# Patient Record
Sex: Female | Born: 1935 | Race: White | Hispanic: No | Smoking: Former smoker
Health system: Southern US, Community
[De-identification: ages and names within clinical notes are randomized; demographics above are authoritative.]

## PROBLEM LIST (undated history)

## (undated) DIAGNOSIS — C801 Malignant (primary) neoplasm, unspecified: Secondary | ICD-10-CM

## (undated) DIAGNOSIS — I10 Essential (primary) hypertension: Secondary | ICD-10-CM

## (undated) DIAGNOSIS — E079 Disorder of thyroid, unspecified: Secondary | ICD-10-CM

---

## 2003-12-12 HISTORY — PX: BREAST LUMPECTOMY: SHX2

## 2019-01-29 ENCOUNTER — Inpatient Hospital Stay
Admission: EM | Admit: 2019-01-29 | Discharge: 2019-02-03 | DRG: 862 | Disposition: A | Discharge status: Home / Self Care | Payer: Medicare HMO | Attending: Surgery | Admitting: Surgery

## 2019-01-29 ENCOUNTER — Encounter: Payer: Self-pay | Admitting: Emergency Medicine

## 2019-01-29 ENCOUNTER — Emergency Department: Payer: Medicare HMO

## 2019-01-29 ENCOUNTER — Other Ambulatory Visit: Payer: Self-pay

## 2019-01-29 DIAGNOSIS — K5730 Diverticulosis of large intestine without perforation or abscess without bleeding: Secondary | ICD-10-CM | POA: Diagnosis present

## 2019-01-29 DIAGNOSIS — C772 Secondary and unspecified malignant neoplasm of intra-abdominal lymph nodes: Secondary | ICD-10-CM | POA: Diagnosis present

## 2019-01-29 DIAGNOSIS — Z923 Personal history of irradiation: Secondary | ICD-10-CM | POA: Diagnosis not present

## 2019-01-29 DIAGNOSIS — D649 Anemia, unspecified: Secondary | ICD-10-CM | POA: Diagnosis present

## 2019-01-29 DIAGNOSIS — Z79899 Other long term (current) drug therapy: Secondary | ICD-10-CM

## 2019-01-29 DIAGNOSIS — N131 Hydronephrosis with ureteral stricture, not elsewhere classified: Secondary | ICD-10-CM | POA: Diagnosis present

## 2019-01-29 DIAGNOSIS — E079 Disorder of thyroid, unspecified: Secondary | ICD-10-CM | POA: Diagnosis present

## 2019-01-29 DIAGNOSIS — Z79890 Hormone replacement therapy (postmenopausal): Secondary | ICD-10-CM | POA: Diagnosis not present

## 2019-01-29 DIAGNOSIS — C679 Malignant neoplasm of bladder, unspecified: Secondary | ICD-10-CM | POA: Diagnosis not present

## 2019-01-29 DIAGNOSIS — K651 Peritoneal abscess: Secondary | ICD-10-CM | POA: Diagnosis present

## 2019-01-29 DIAGNOSIS — C662 Malignant neoplasm of left ureter: Secondary | ICD-10-CM | POA: Diagnosis present

## 2019-01-29 DIAGNOSIS — T8143XA Infection following a procedure, organ and space surgical site, initial encounter: Secondary | ICD-10-CM | POA: Diagnosis present

## 2019-01-29 DIAGNOSIS — K5900 Constipation, unspecified: Secondary | ICD-10-CM | POA: Diagnosis present

## 2019-01-29 DIAGNOSIS — Y838 Other surgical procedures as the cause of abnormal reaction of the patient, or of later complication, without mention of misadventure at the time of the procedure: Secondary | ICD-10-CM | POA: Diagnosis present

## 2019-01-29 DIAGNOSIS — Z791 Long term (current) use of non-steroidal anti-inflammatories (NSAID): Secondary | ICD-10-CM | POA: Diagnosis not present

## 2019-01-29 DIAGNOSIS — I10 Essential (primary) hypertension: Secondary | ICD-10-CM | POA: Diagnosis present

## 2019-01-29 DIAGNOSIS — Z79891 Long term (current) use of opiate analgesic: Secondary | ICD-10-CM

## 2019-01-29 DIAGNOSIS — Z681 Body mass index (BMI) 19 or less, adult: Secondary | ICD-10-CM | POA: Diagnosis not present

## 2019-01-29 DIAGNOSIS — L8990 Pressure ulcer of unspecified site, unspecified stage: Secondary | ICD-10-CM

## 2019-01-29 DIAGNOSIS — Z853 Personal history of malignant neoplasm of breast: Secondary | ICD-10-CM

## 2019-01-29 DIAGNOSIS — R109 Unspecified abdominal pain: Secondary | ICD-10-CM

## 2019-01-29 DIAGNOSIS — T8149XA Infection following a procedure, other surgical site, initial encounter: Secondary | ICD-10-CM

## 2019-01-29 DIAGNOSIS — E440 Moderate protein-calorie malnutrition: Secondary | ICD-10-CM | POA: Diagnosis present

## 2019-01-29 DIAGNOSIS — N200 Calculus of kidney: Secondary | ICD-10-CM | POA: Diagnosis present

## 2019-01-29 DIAGNOSIS — Z87891 Personal history of nicotine dependence: Secondary | ICD-10-CM

## 2019-01-29 HISTORY — DX: Malignant (primary) neoplasm, unspecified: C80.1

## 2019-01-29 HISTORY — DX: Disorder of thyroid, unspecified: E07.9

## 2019-01-29 HISTORY — DX: Essential (primary) hypertension: I10

## 2019-01-29 LAB — LACTIC ACID, PLASMA: Lactic Acid, Venous: 1.1 mmol/L (ref 0.5–1.9)

## 2019-01-29 LAB — CBC WITH DIFFERENTIAL/PLATELET
Abs Immature Granulocytes: 0.06 10*3/uL (ref 0.00–0.07)
Basophils Absolute: 0 10*3/uL (ref 0.0–0.1)
Basophils Relative: 0 %
Eosinophils Absolute: 0.1 10*3/uL (ref 0.0–0.5)
Eosinophils Relative: 1 %
HCT: 28.9 % — ABNORMAL LOW (ref 36.0–46.0)
Hemoglobin: 9.1 g/dL — ABNORMAL LOW (ref 12.0–15.0)
Immature Granulocytes: 1 %
Lymphocytes Relative: 11 %
Lymphs Abs: 1 10*3/uL (ref 0.7–4.0)
MCH: 27.6 pg (ref 26.0–34.0)
MCHC: 31.5 g/dL (ref 30.0–36.0)
MCV: 87.6 fL (ref 80.0–100.0)
Monocytes Absolute: 0.8 10*3/uL (ref 0.1–1.0)
Monocytes Relative: 9 %
NEUTROS PCT: 78 %
Neutro Abs: 7.1 10*3/uL (ref 1.7–7.7)
Platelets: 311 10*3/uL (ref 150–400)
RBC: 3.3 MIL/uL — ABNORMAL LOW (ref 3.87–5.11)
RDW: 14.6 % (ref 11.5–15.5)
WBC: 9.1 10*3/uL (ref 4.0–10.5)
nRBC: 0 % (ref 0.0–0.2)

## 2019-01-29 LAB — COMPREHENSIVE METABOLIC PANEL
ALT: 72 U/L — AB (ref 0–44)
AST: 79 U/L — ABNORMAL HIGH (ref 15–41)
Albumin: 2.2 g/dL — ABNORMAL LOW (ref 3.5–5.0)
Alkaline Phosphatase: 426 U/L — ABNORMAL HIGH (ref 38–126)
Anion gap: 6 (ref 5–15)
BUN: 16 mg/dL (ref 8–23)
CO2: 26 mmol/L (ref 22–32)
Calcium: 8 mg/dL — ABNORMAL LOW (ref 8.9–10.3)
Chloride: 98 mmol/L (ref 98–111)
Creatinine, Ser: 0.99 mg/dL (ref 0.44–1.00)
GFR calc Af Amer: >60 mL/min (ref >60)
GFR calc non Af Amer: 54 mL/min — ABNORMAL LOW (ref >60)
Glucose, Bld: 111 mg/dL — ABNORMAL HIGH (ref 70–99)
Potassium: 3.9 mmol/L (ref 3.5–5.1)
Sodium: 130 mmol/L — ABNORMAL LOW (ref 135–145)
Total Bilirubin: 1.5 mg/dL — ABNORMAL HIGH (ref 0.3–1.2)
Total Protein: 6.4 g/dL — ABNORMAL LOW (ref 6.5–8.1)

## 2019-01-29 LAB — CBC
HCT: 31.9 % — ABNORMAL LOW (ref 36.0–46.0)
Hemoglobin: 10.1 g/dL — ABNORMAL LOW (ref 12.0–15.0)
MCH: 27.4 pg (ref 26.0–34.0)
MCHC: 31.7 g/dL (ref 30.0–36.0)
MCV: 86.7 fL (ref 80.0–100.0)
Platelets: 382 10*3/uL (ref 150–400)
RBC: 3.68 MIL/uL — ABNORMAL LOW (ref 3.87–5.11)
RDW: 14.6 % (ref 11.5–15.5)
WBC: 12 10*3/uL — AB (ref 4.0–10.5)
nRBC: 0 % (ref 0.0–0.2)

## 2019-01-29 LAB — URINALYSIS, COMPLETE (UACMP) WITH MICROSCOPIC
BILIRUBIN URINE: NEGATIVE
Glucose, UA: NEGATIVE mg/dL
Ketones, ur: NEGATIVE mg/dL
Nitrite: NEGATIVE
Protein, ur: NEGATIVE mg/dL
Specific Gravity, Urine: 1.012 (ref 1.005–1.030)
WBC, UA: >50 WBC/hpf — ABNORMAL HIGH (ref 0–5)
pH: 6 (ref 5.0–8.0)

## 2019-01-29 LAB — TYPE AND SCREEN
ABO/RH(D): A POS
Antibody Screen: NEGATIVE

## 2019-01-29 LAB — PROTIME-INR
INR: 1.07
Prothrombin Time: 13.8 seconds (ref 11.4–15.2)

## 2019-01-29 LAB — LIPASE, BLOOD: Lipase: 98 U/L — ABNORMAL HIGH (ref 11–51)

## 2019-01-29 MED ORDER — IOPAMIDOL (ISOVUE-300) INJECTION 61%
30.0000 mL | Freq: Once | INTRAVENOUS | Status: AC | PRN
  Administered 2019-01-29: 30 mL via ORAL

## 2019-01-29 MED ORDER — LEVOTHYROXINE SODIUM 88 MCG PO TABS
88.0000 ug | ORAL_TABLET | Freq: Every day | ORAL | Status: DC
  Administered 2019-01-30 – 2019-02-03 (×4): 88 ug via ORAL
  Filled 2019-01-29 (×5): qty 1

## 2019-01-29 MED ORDER — ONDANSETRON 4 MG PO TBDP: 4.0000 mg | ORAL_TABLET | Freq: Four times a day (QID) | ORAL | Status: DC | PRN

## 2019-01-29 MED ORDER — ONDANSETRON HCL 4 MG/2ML IJ SOLN: 4.0000 mg | Freq: Four times a day (QID) | INTRAMUSCULAR | Status: DC | PRN

## 2019-01-29 MED ORDER — LISINOPRIL 10 MG PO TABS
5.0000 mg | ORAL_TABLET | Freq: Every day | ORAL | Status: DC
  Administered 2019-02-02 – 2019-02-03 (×2): 5 mg via ORAL
  Filled 2019-01-29 (×2): qty 1

## 2019-01-29 MED ORDER — LACTATED RINGERS IV SOLN
INTRAVENOUS | Status: DC
  Administered 2019-01-29 – 2019-01-30 (×3): via INTRAVENOUS

## 2019-01-29 MED ORDER — PIPERACILLIN-TAZOBACTAM 3.375 G IVPB 30 MIN
3.3750 g | Freq: Once | INTRAVENOUS | Status: AC
  Administered 2019-01-29: 3.375 g via INTRAVENOUS
  Filled 2019-01-29: qty 50

## 2019-01-29 MED ORDER — SODIUM CHLORIDE 0.9 % IV BOLUS
1000.0000 mL | Freq: Once | INTRAVENOUS | Status: AC
  Administered 2019-01-29: 1000 mL via INTRAVENOUS

## 2019-01-29 MED ORDER — SODIUM CHLORIDE 0.9% FLUSH
3.0000 mL | Freq: Once | INTRAVENOUS | Status: AC
  Administered 2019-01-29: 3 mL via INTRAVENOUS

## 2019-01-29 MED ORDER — ACETAMINOPHEN 325 MG PO TABS
650.0000 mg | ORAL_TABLET | Freq: Four times a day (QID) | ORAL | Status: DC | PRN
  Administered 2019-01-29 – 2019-01-31 (×4): 650 mg via ORAL
  Filled 2019-01-29 (×4): qty 2

## 2019-01-29 MED ORDER — ACETAMINOPHEN 650 MG RE SUPP: 650.0000 mg | Freq: Four times a day (QID) | RECTAL | Status: DC | PRN

## 2019-01-29 MED ORDER — IOHEXOL 300 MG/ML  SOLN
75.0000 mL | Freq: Once | INTRAMUSCULAR | Status: AC | PRN
  Administered 2019-01-29: 75 mL via INTRAVENOUS

## 2019-01-29 MED ORDER — PIPERACILLIN-TAZOBACTAM 3.375 G IVPB
3.3750 g | Freq: Three times a day (TID) | INTRAVENOUS | Status: DC
  Administered 2019-01-30 – 2019-02-02 (×11): 3.375 g via INTRAVENOUS
  Filled 2019-01-29 (×11): qty 50

## 2019-01-29 MED ORDER — MORPHINE SULFATE (PF) 2 MG/ML IV SOLN: 2.0000 mg | INTRAVENOUS | Status: DC | PRN

## 2019-01-29 MED ORDER — DOCUSATE SODIUM 100 MG PO CAPS: 100.0000 mg | ORAL_CAPSULE | Freq: Two times a day (BID) | ORAL | Status: DC | PRN

## 2019-01-29 MED ORDER — PANTOPRAZOLE SODIUM 40 MG IV SOLR
40.0000 mg | Freq: Every day | INTRAVENOUS | Status: DC
  Administered 2019-01-29 – 2019-02-01 (×4): 40 mg via INTRAVENOUS
  Filled 2019-01-29 (×4): qty 40

## 2019-01-29 NOTE — ED Notes (Signed)
Pt returned from CT °

## 2019-01-29 NOTE — ED Notes (Signed)
Pt ambulatory to toilet. Warm blanket given.

## 2019-01-29 NOTE — ED Notes (Signed)
MD at bedside. 

## 2019-01-29 NOTE — ED Notes (Signed)
RN Anda Kraft notified pt to placed in [facility]

## 2019-01-29 NOTE — ED Provider Notes (Addendum)
[facility] Emergency Department Provider Note  ____________________________________________   I have reviewed the triage vital signs and the nursing notes. Where available I have reviewed prior notes and, if possible and indicated, outside hospital notes.    HISTORY  Chief Complaint Abdominal Pain    HPI Tonya Crawford is a 83 y.o. female who presents today complaining of concern about a possible SBO.  Patient is not a patient of this hospital.  She has had a recent complicated history at a outside facility that the records are now transferring over from.  [facility].  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  This was a laparoscopic procedure 2 weeks ago.  Since that time she has had going but not worsening diffuse abdominal pain, and she went to see an oncologist, on Thursday the oncologist reported that her PET scan showed "something" in her bowels on the left side, unclear if this was constipation or partial SBO.  She was to receive a "large enema" at home but that was not able to be obtained.  Patient is passing small amounts of stool.  She has been somewhat constipated since her recent admission at which time she was on Dilaudid.  She has had no vomiting.  She has not initiated any oncologic procedures.  Her daughter lives here in [place], and they are moving her here so that they can establish care and continue this where her daughter can take care of her.  Therefore, in addition to attention from this problem they are going to require outpatient follow-up.  Patient has had no fever, no dysuria, no headaches or stiff neck for cough or chest pain, she does have persistent abdominal pain which gets better with Aleve, nothing else makes it  better or worse.  She does not know if she had any elevated liver function tests or other concerns with her blood work at the outside hospital.  We have sent a request to the outside hospital to obtain these records.  Has not had much to eat or drink and her energy is down since the surgery   Past Medical History:  Diagnosis Date  . [facility])    Breast Cancer  . Cancer ([place])    Urothilial Cancer  . Hypertension   . Thyroid disease     There are no active problems to display for this patient.     Prior to Admission medications   Medication Sig Start Date End Date Taking? Authorizing Provider  levothyroxine (SYNTHROID, LEVOTHROID) 88 MCG tablet Take 88 mcg by mouth daily. 01/05/19   [provider]  lisinopril (PRINIVIL,ZESTRIL) 5 MG tablet Take 5 mg by mouth daily. 11/08/18   [provider]  oxyCODONE-acetaminophen (PERCOCET/ROXICET) 5-325 MG tablet Take 1 tablet by mouth every 6 (six) hours as needed for pain. 01/15/19   [provider]    Allergies Patient has no known allergies.  No family history on file.  Social History Social History   Tobacco Use  . Smoking status: Never Smoker  . Smokeless tobacco: Never Used  Substance Use Topics  . Alcohol use: Yes    Comment: occasionally  . Drug use: Not on file    Review of Systems Constitutional: No fever/chills Eyes: No visual changes. ENT: No sore  throat. No stiff neck no neck pain Cardiovascular: Denies chest pain. Respiratory: Denies shortness of breath. Gastrointestinal:   no vomiting.  No diarrhea.  As above for mild constipation. Genitourinary: Negative for dysuria. Musculoskeletal: Negative lower extremity swelling Skin: Negative for rash. Neurological: Negative for severe headaches, focal weakness or numbness.   ____________________________________________   PHYSICAL EXAM:  VITAL SIGNS: ED Triage Vitals  Enc Vitals Group     BP 01/29/19 1014 (!) 125/52     Pulse Rate  01/29/19 1014 86     Resp 01/29/19 1014 18     Temp 01/29/19 1014 98.2 F (36.8 C)     Temp Source 01/29/19 1014 Oral     SpO2 01/29/19 1014 98 %     Weight 01/29/19 1015 122 lb (55.3 kg)     Height 01/29/19 1015 5' 6.5" (1.689 m)     Head Circumference --      Peak Flow --      Pain Score 01/29/19 1014 2     Pain Loc --      Pain Edu? --      Excl. in [place]? --     Constitutional: Alert and oriented.  No acute distress, somewhat frail in appearance Eyes: Conjunctivae are normal Head: Atraumatic HEENT: No congestion/rhinnorhea. Mucous membranes are dry.  Oropharynx non-erythematous Neck:   Nontender with no meningismus, no masses, no stridor Cardiovascular: Normal rate, regular rhythm. Grossly normal heart sounds.  Good peripheral circulation. Respiratory: Normal respiratory effort.  No retractions. Lungs CTAB. Abdominal: Soft and mild discomfort appreciated to my exam mostly epigastric and some extent right upper and left upper quadrant. No distention. No guarding no rebound Back:  There is no focal tenderness or step off.  there is no midline tenderness there are no lesions noted. there is no CVA tenderness Musculoskeletal: No lower extremity tenderness, no upper extremity tenderness. No joint effusions, no DVT signs strong distal pulses no edema Neurologic:  Normal speech and language. No gross focal neurologic deficits are appreciated.  Skin:  Skin is warm, dry and intact. No rash noted. Psychiatric: Mood and affect are normal. Speech and behavior are normal.  ____________________________________________   LABS (all labs ordered are listed, but only abnormal results are displayed)  Labs Reviewed  LIPASE, BLOOD - Abnormal; Notable for the following components:      Result Value   Lipase 98 (*)    All other components within normal limits  COMPREHENSIVE METABOLIC PANEL - Abnormal; Notable for the following components:   Sodium 130 (*)    Glucose, Bld 111 (*)    Calcium 8.0  (*)    Total Protein 6.4 (*)    Albumin 2.2 (*)    AST 79 (*)    ALT 72 (*)    Alkaline Phosphatase 426 (*)    Total Bilirubin 1.5 (*)    GFR calc non Af Amer 54 (*)    All other components within normal limits  CBC - Abnormal; Notable for the following components:   WBC 12.0 (*)    RBC 3.68 (*)    Hemoglobin 10.1 (*)    HCT 31.9 (*)    All other components within normal limits  URINALYSIS, COMPLETE (UACMP) WITH MICROSCOPIC    Pertinent labs  results that were available during my care of the patient were reviewed by me and considered in my medical decision making (see chart for details). ____________________________________________  EKG  I personally interpreted any EKGs ordered by me or triage  ____________________________________________  RADIOLOGY  Pertinent labs & imaging results that were available during my care of the patient were reviewed by me and considered in my medical decision making (see chart for details). If possible, patient and/or family made aware of any abnormal findings.  No results found. ____________________________________________    PROCEDURES  Procedure(s) performed: None  Procedures  Critical Care performed: None  ____________________________________________   INITIAL IMPRESSION / ASSESSMENT AND PLAN / ED COURSE  Pertinent labs & imaging results that were available during my care of the patient were reviewed by me and considered in my medical decision making (see chart for details).  An essentially in media res of a a somewhat complicated oncologic work-up, she has a nonsurgical abdomen she is been having abdominal discomfort since her procedure 2 weeks ago, she has not initiated oncologic intervention, there is concern about "something" on the PET scan that could be either an SBO or constipation which was going to be treated as an outpatient but they were unable to get that accomplished they brought her in.  Patient has had no vomiting or  fever and her pain is not significantly changed since the procedure itself.  She did have a 2-day stay after the procedure she states.  We are trying to get more records.  It is the intention of the family to keep her here and they would like to reinstitute care with local urologist and local oncologist.  This is certainly something we can try to help him with.  Her belly is nonsurgical but I do not know what they were seeing on the PET scan we will obtain a CT scan as I think that will be needed anyway for imaging in our hospital, we will attempt and we are attempting to get outpatient records including blood work so we have a sense of what we should expect in terms of her levels of her liver function test etc.  Renal function is clear, we will give her IV fluids she appears dehydrated, and we will reassess  ----------------------------------------- 3:42 PM on 01/29/2019 -----------------------------------------  Paging general surgery, CT results show what is reported to me to be abscesses, as well as possible extravasation of venous contrast, not thought to be arterial, patient is hemodynamically stable we will add a lactic, we will give her Zosyn, this is a complicated situation as patient received her surgery at a different facility and we still of no records.  These are merely abscesses she likely would be stable for transport, I will discuss with surgery whether they feel that she is stable with that read.     ----------------------------------------- 3:49 PM on 01/29/2019 ----------------------------------------- Discussed with Dr. Lysle Pearl, is in the operating room, we will start the patient on Zosyn, we will have him assess the patient prior to any possible transfer and he and I talked about the patient's possible extravasation, her recent history her findings on exam blood work etc.  He will be done as soon as he can  ----------------------------------------- 4:06 PM on  01/29/2019 -----------------------------------------  Vital signs are stable, no evidence of significant bleeding belly remains unchanged, we are giving her Zosyn, we have given her IV fluids, I have talked to vascular surgery they do not feel there is any role for vascular surgery in taking care of her, we are awaiting further input from surgery prior to possible transfer and/or admission.  Family kept abreast of updated formation.  ----------------------------------------- 4:50 PM on 01/29/2019 -----------------------------------------  In no acute distress at this  time vital signs are stable, they are very adamant that they do not wish to be transferred back to [facility], they want to get their care here that is where the daughter is they do want to reinstitute care here.  Obviously I am reluctant to put her in an ambulance anyway even though she is been stable here thus far given CT read, and we await surgical input.  I have given her antibiotics.  She is in no acute distress at this time.  Family are kept abreast of the situation. ____________________________________________   FINAL CLINICAL IMPRESSION(S) / ED DIAGNOSES  Final diagnoses:  None      This chart was dictated using voice recognition software.  Despite best efforts to proofread,  errors can occur which can change meaning.      Schuyler Amor, MD 01/29/19 1247    Schuyler Amor, MD 01/29/19 1543    Schuyler Amor, MD 01/29/19 1549    Schuyler Amor, MD 01/29/19 1607    Schuyler Amor, MD 01/29/19 1651    Schuyler Amor, MD 01/29/19 [phone]

## 2019-01-29 NOTE — ED Notes (Signed)
Pt has drank a little over a bottle of contrast. States she feels full and can't drink anymore. MD notified, ok for CT at this time. CT notified.

## 2019-01-29 NOTE — ED Notes (Signed)
Pt in CT.

## 2019-01-29 NOTE — ED Notes (Signed)
ED TO INPATIENT HANDOFF REPORT  Name/Age/Gender Tonya Crawford 83 y.o. female  Code Status   Home/SNF/Other Home  Chief Complaint obstruction bowel  Level of Care/Admitting Diagnosis ED Disposition    ED Disposition Condition [facility] Area: [place] [[id number]]  Level of Care: Med-Surg [16]  Diagnosis: Postoperative intra-abdominal abscess [109323]  Admitting Physician: Benjamine Sprague [[id number]]  Attending Physician: Benjamine Sprague [[id number]]  Estimated length of stay: 3 - 4 days  Certification:: I certify this patient will need inpatient services for at least 2 midnights  PT Class (Do Not Modify): Inpatient [101]  PT Acc Code (Do Not Modify): Private [1]       Medical History Past Medical History:  Diagnosis Date  . Cancer [facility])    Breast Cancer  . Cancer ([place])    Urothilial Cancer  . Hypertension   . Thyroid disease     Allergies No Known Allergies  IV Location/Drains/Wounds Patient Lines/Drains/Airways Status   Active Line/Drains/Airways    Name:   Placement date:   Placement time:   Site:   Days:   Peripheral IV 01/29/19 Left Antecubital   01/29/19    1045    Antecubital   less than 1   Peripheral IV 01/29/19 Right Forearm   01/29/19    1618    Forearm   less than 1          Labs/Imaging Results for orders placed or performed during the hospital encounter of 01/29/19 (from the past 48 hour(s))  Lipase, blood     Status: Abnormal   Collection Time: 01/29/19 10:23 AM  Result Value Ref Range   Lipase 98 (H) 11 - 51 U/L    Comment: Performed at [facility], [address] [place], [place] [address]  Comprehensive metabolic panel     Status: Abnormal   Collection Time: 01/29/19 10:23 AM  Result Value Ref Range   Sodium 130 (L) 135 - 145 mmol/L   Potassium 3.9 3.5 - 5.1 mmol/L   Chloride 98 98 - 111 mmol/L   CO2 26 22 - 32 mmol/L   Glucose, Bld 111 (H) 70 - 99 mg/dL   BUN 16 8 - 23 mg/dL   Creatinine, Ser  0.99 0.44 - 1.00 mg/dL   Calcium 8.0 (L) 8.9 - 10.3 mg/dL   Total Protein 6.4 (L) 6.5 - 8.1 g/dL   Albumin 2.2 (L) 3.5 - 5.0 g/dL   AST 79 (H) 15 - 41 U/L   ALT 72 (H) 0 - 44 U/L   Alkaline Phosphatase 426 (H) 38 - 126 U/L   Total Bilirubin 1.5 (H) 0.3 - 1.2 mg/dL   GFR calc non Af Amer 54 (L) >60 mL/min   GFR calc Af Amer >60 >60 mL/min   Anion gap 6 5 - 15    Comment: Performed at [facility], [address] [place], [place] [address]  CBC     Status: Abnormal   Collection Time: 01/29/19 10:23 AM  Result Value Ref Range   WBC 12.0 (H) 4.0 - 10.5 K/uL   RBC 3.68 (L) 3.87 - 5.11 MIL/uL   Hemoglobin 10.1 (L) 12.0 - 15.0 g/dL   HCT 31.9 (L) 36.0 - 46.0 %   MCV 86.7 80.0 - 100.0 fL   MCH 27.4 26.0 - 34.0 pg   MCHC 31.7 30.0 - 36.0 g/dL   RDW 14.6 11.5 - 15.5 %   Platelets 382 150 - 400 K/uL  nRBC 0.0 0.0 - 0.2 %    Comment: Performed at [facility], [address] [place], [place] [address]  Urinalysis, Complete w Microscopic     Status: Abnormal   Collection Time: 01/29/19 10:24 AM  Result Value Ref Range   Color, Urine YELLOW (A) YELLOW   APPearance CLOUDY (A) CLEAR   Specific Gravity, Urine 1.012 1.005 - 1.030   pH 6.0 5.0 - 8.0   Glucose, UA NEGATIVE NEGATIVE mg/dL   Hgb urine dipstick LARGE (A) NEGATIVE   Bilirubin Urine NEGATIVE NEGATIVE   Ketones, ur NEGATIVE NEGATIVE mg/dL   Protein, ur NEGATIVE NEGATIVE mg/dL   Nitrite NEGATIVE NEGATIVE   Leukocytes,Ua LARGE (A) NEGATIVE   RBC / HPF 21-50 0 - 5 RBC/hpf   WBC, UA >50 (H) 0 - 5 WBC/hpf   Bacteria, UA RARE (A) NONE SEEN   Squamous Epithelial / LPF 11-20 0 - 5    Comment: Performed at [facility], [address] [place], [place] [address]  Lactic acid, plasma     Status: None   Collection Time: 01/29/19  4:06 PM  Result Value Ref Range   Lactic Acid, Venous 1.1 0.5 - 1.9 mmol/L    Comment: Performed at [facility], [address] [place], [place] [address]  Type and  screen [place]     Status: None   Collection Time: 01/29/19  4:08 PM  Result Value Ref Range   ABO/RH(D) A POS    Antibody Screen NEG    Sample Expiration      02/01/2019 Performed at [facility], [address] [place], [place] [address]   Protime-INR     Status: None   Collection Time: 01/29/19  4:41 PM  Result Value Ref Range   Prothrombin Time 13.8 11.4 - 15.2 seconds   INR 1.07     Comment: Performed at [facility], [address] [place], [place] [address]  CBC with Differential/Platelet     Status: Abnormal   Collection Time: 01/29/19  6:36 PM  Result Value Ref Range   WBC 9.1 4.0 - 10.5 K/uL   RBC 3.30 (L) 3.87 - 5.11 MIL/uL   Hemoglobin 9.1 (L) 12.0 - 15.0 g/dL   HCT 28.9 (L) 36.0 - 46.0 %   MCV 87.6 80.0 - 100.0 fL   MCH 27.6 26.0 - 34.0 pg   MCHC 31.5 30.0 - 36.0 g/dL   RDW 14.6 11.5 - 15.5 %   Platelets 311 150 - 400 K/uL   nRBC 0.0 0.0 - 0.2 %   Neutrophils Relative % 78 %   Neutro Abs 7.1 1.7 - 7.7 K/uL   Lymphocytes Relative 11 %   Lymphs Abs 1.0 0.7 - 4.0 K/uL   Monocytes Relative 9 %   Monocytes Absolute 0.8 0.1 - 1.0 K/uL   Eosinophils Relative 1 %   Eosinophils Absolute 0.1 0.0 - 0.5 K/uL   Basophils Relative 0 %   Basophils Absolute 0.0 0.0 - 0.1 K/uL   Immature Granulocytes 1 %   Abs Immature Granulocytes 0.06 0.00 - 0.07 K/uL    Comment: Performed at [facility], [address], [place],  [address]   Ct Abdomen Pelvis W Contrast  Result Date: 01/29/2019 CLINICAL DATA:  83 year old with a recently diagnosed urothelial neoplasm at [facility] in [place]. By report, she underwent laparoscopic surgery for nephrectomy which was aborted due to the findings. She presents now with worsening generalized abdominal  pain. She has leukocytosis, elevated lipase, elevated liver function tests, hypoalbuminemia and hypocalcemia on laboratory evaluation. EXAM: CT ABDOMEN AND PELVIS WITH CONTRAST  TECHNIQUE: Multidetector CT imaging of the abdomen and pelvis was performed using the standard protocol following bolus administration of intravenous contrast. Delayed imaging through the abdomen and pelvis was also performed. CONTRAST:  35mL OMNIPAQUE IOHEXOL 300 MG/ML IV. Oral contrast was also administered. COMPARISON:  No outside imaging is currently available for comparison. FINDINGS: Lower chest: Small BILATERAL pleural effusions, LEFT greater than RIGHT, with associated passive atelectasis in the lower lobes. Visualized lung bases otherwise clear. Heart size upper normal. No pericardial effusion. Hepatobiliary: Geographic low attenuation scattered throughout the liver. Approximate 1.3 cm mass involving the LEFT lobe (series 2, image 20). No other discrete hepatic masses. Small gallstones in the gallbladder without CT evidence of acute cholecystitis. No biliary ductal dilation. Pancreas: Approximate 1.1 x 1.0 cm well-circumscribed simple cyst arising from the proximal tail of the pancreas. No other pancreatic masses. No peripancreatic edema/inflammation. Spleen: Normal in size and appearance. Small focus of accessory splenic tissue ANTERIOR to the spleen at the hilum. Adrenals/Urinary Tract: Normal appearing adrenal glands. Large mass with its epicenter in the LEFT renal pelvis extending along the length of the proximal and mid ureter in the LEFT retroperitoneum over a length of at least 15 cm, and perhaps involving the entire ureter. The mass causes marked hydronephrosis involving the LEFT kidney with significant obstruction, as there is delayed contrast excretion by the LEFT kidney and no contrast in the collecting system on the delayed images. Mild RIGHT hydroureteronephrosis, though there is no obstructing stone or mass in the RIGHT ureter, and the ureter can be followed to the bladder on the delayed images. Adjacent non-obstructing RIGHT LOWER pole renal calculi, the largest measuring approximately 4 mm.  RIGHT kidney otherwise normal in appearance. Filling defect dependently in the urinary bladder on the delayed images is likely blood clot; this clot is adjacent to the RIGHT ureteral orifice and may account for the mild hydroureteronephrosis. Stomach/Bowel: Stomach normal in appearance for the degree of distention. Normal-appearing small bowel, with upper normal caliber distal jejunal loops. Marked thickening of the wall of the descending colon, sigmoid colon and rectum. Sigmoid colon diverticulosis. Mobile cecum positioned in the RIGHT UPPER QUADRANT of the abdomen. Lipoma involving the ileocecal valve. Appendix not reliably identified, but no evidence of pericecal inflammation. Vascular/Lymphatic: Moderate aortoiliofemoral atherosclerosis without evidence of aneurysm. LEFT periaortic retroperitoneal lymphadenopathy adjacent to the urothelial neoplasm, the largest node measuring approximately 1.7 x 1.0 cm. Scattered normal sized lymph nodes elsewhere in the retroperitoneum. No pathologic lymphadenopathy elsewhere. Reproductive: Normal appearing uterus.  No adnexal masses. Other: Bilobed fluid collection with an enhancing wall containing gas in the LEFT mid abdomen, adjacent to the mid descending colon, measuring approximately 9 x 7.5 x 9.5 cm. There is high attenuation contrast material within this collection and since there is no oral contrast in the adjacent bowel, I believe this is extravasated vascular contrast from a branch of what I believe is the INFERIOR mesenteric vein. Fluid collection with enhancing wall in the dependent portion of the pelvis measuring approximately 5 x 2 x 2 cm. Musculoskeletal: No acute findings. No evidence of osseous metastatic disease. Degenerative disc disease at L4-5 and L5-S1. Bone islands in the first sacral segment. IMPRESSION: 1. Large urothelial neoplasm involving the LEFT renal pelvis and the LEFT ureter, likely along its entire length, causing marked LEFT UPPER urinary  tract obstruction. Adjacent LEFT periaortic retroperitoneal  metastatic lymphadenopathy. 2. Large abscess in the LEFT mid abdomen, adjacent to the mid descending colon, with extravasated likely vascular contrast within the collection. It would appear that the contrast arises from an INFERIOR mesenteric vessel, likely a branch of the INFERIOR mesenteric vein. 3. Smaller abscess in the dependent portion of the pelvis. 4. Descending and sigmoid colitis and proctitis. Sigmoid colon diverticulosis. 5. Filling defect in the urinary bladder on delayed imaging indicating a blood clot. This is causing partial obstruction of the RIGHT urinary tract, as there is mild hydroureteronephrosis but no obstructing stone or mass. 6. Non-obstructing approximate 4 mm calculus or adjacent calculi in a LOWER pole calyx of the RIGHT kidney. 7. Geographic areas of hepatic steatosis. Indeterminate solitary approximate 1.2 cm lesion involving the LEFT lobe of liver. 8. Approximate 1 cm simple cyst arising from the proximal tail of the pancreas. This is most likely benign, given its appearance. This can be evaluated on follow-up CT examinations during her course of therapy. 9. Cholelithiasis without sonographic evidence of acute cholecystitis. 10. Small BILATERAL pleural effusions, LEFT greater than RIGHT, and associated mild passive atelectasis involving the lower lobes. Aortic Atherosclerosis (ICD10-170.0) I would telephoned these results directly to Dr. Burlene Arnt of the emergency department at the time of interpretation on 01/29/2019 at 3:30 p.m. Electronically Signed   By: Evangeline Dakin M.D.   On: 01/29/2019 15:36    Pending Labs Unresulted Labs (From admission, onward)    Start     Ordered   01/29/19 1606  Urine culture  Add-on,   AD     01/29/19 1605   01/29/19 1601  Culture, blood (routine x 2)  BLOOD CULTURE X 2,   STAT     01/29/19 1600   Signed and Held  Basic metabolic panel  Daily,   R     Signed and Held   Signed and  Held  Magnesium  Daily,   R     Signed and Held   Signed and Held  Phosphorus  Daily,   R     Signed and Held   Signed and Held  CBC  Daily,   R     Signed and Held          Vitals/Pain Today's Vitals   01/29/19 1700 01/29/19 1800 01/29/19 1830 01/29/19 1923  BP: (!) 126/54   125/65  Pulse: 74 72 73 78  Resp: 16 12 (!) 23 19  Temp:      TempSrc:      SpO2: 96% 98% 97% 97%  Weight:      Height:      PainSc:        Isolation Precautions No active isolations  Medications Medications  piperacillin-tazobactam (ZOSYN) IVPB 3.375 g (has no administration in time range)  sodium chloride flush (NS) 0.9 % injection 3 mL (3 mLs Intravenous Given 01/29/19 1257)  sodium chloride 0.9 % bolus 1,000 mL (0 mLs Intravenous Stopped 01/29/19 1430)  iopamidol (ISOVUE-300) 61 % injection 30 mL (30 mLs Oral Contrast Given 01/29/19 1319)  iohexol (OMNIPAQUE) 300 MG/ML solution 75 mL (75 mLs Intravenous Contrast Given 01/29/19 1412)  piperacillin-tazobactam (ZOSYN) IVPB 3.375 g ( Intravenous Stopped 01/29/19 1644)    Mobility walks

## 2019-01-29 NOTE — Consult Note (Signed)
Pharmacy Antibiotic Note  Tonya Crawford is a 83 y.o. female admitted on 01/29/2019 with intra-abdominal infection.  Pharmacy has been consulted for Zosyn dosing.  Plan: Zosyn 3.375g IV q8h (4 hour infusion).  Height: 5' 6.5" (168.9 cm) Weight: 122 lb (55.3 kg) IBW/kg (Calculated) : 60.45  Temp (24hrs), Avg:98.2 F (36.8 C), Min:98.2 F (36.8 C), Max:98.2 F (36.8 C)  Recent Labs  Lab 01/29/19 1023 01/29/19 1606 01/29/19 1836  WBC 12.0*  --  9.1  CREATININE 0.99  --   --   LATICACIDVEN  --  1.1  --     Estimated Creatinine Clearance: 39.6 mL/min (by C-G formula based on SCr of 0.99 mg/dL).    No Known Allergies  Antimicrobials this admission: Zosyn 2/15 >>   Dose adjustments this admission:   Microbiology results: 2/15 BCx: pending 2/15 UCx: pending   Thank you for allowing pharmacy to be a part of this patient's care.  Forrest Moron, PharmD Clinical Pharmacist 01/29/2019 7:46 PM

## 2019-01-29 NOTE — ED Triage Notes (Signed)
Patient presents to the ED with left lower quadrant pain x 2 weeks that has been more severe.  Pain started after patient had a laparoscopic surgery.  Patient had a PET scan 2 days ago that showed a bowel obstruction.  Patient was going to wait to have a home health nurse perform a "high volume enema" but due to paperwork, etc. It is going to be several more days and patient does not feel she is able to wait.  Patient has been passing some watery stool and taking enemas but pain is not relieved.  Patient appears pale.

## 2019-01-29 NOTE — H&P (Signed)
Subjective:   CC: abdominal pain  HPI:  Tonya Crawford is a 83 y.o. female who is consulted by [facility] for evaluation of  above cc.  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  Patient states an abdominal pain that is been constant, sharp and crampy in nature, has been present for the past couple weeks.  Overall she denies any improvement, was reassured by the operating surgeon that everything is okay.  She presents due to the persistent pain work-up revealed possible intra-abdominal fluid collections, stent with an abscess with possibility of a active bleed.  Patient states the pain is tolerable at this time, she has been able to eat and drink but feels like the pain worsens with p.o. intake.  Last bowel movement was diarrhea.  Nonbloody in nature.   Past Medical History:  has a past medical history of Cancer ([place]), Cancer ([place]), Hypertension, and Thyroid disease.  Past Surgical History: Diagnostic laparoscopy  Family History: Reviewed and not relevant to chief complaint  Social History:  reports that she has never smoked. She has never used smokeless tobacco. She reports current alcohol use. No history on file for drug.  Current Medications:  Medications Prior to Admission  Medication Sig Dispense Refill  . ibuprofen (ADVIL,MOTRIN) 200 MG tablet Take 200 mg by mouth every 6 (six) hours as needed.    Marland Kitchen levothyroxine (SYNTHROID, LEVOTHROID) 88 MCG tablet Take 88 mcg by mouth daily.    Marland Kitchen lisinopril (PRINIVIL,ZESTRIL) 5 MG tablet Take 5 mg by mouth daily.    Marland Kitchen oxyCODONE-acetaminophen (PERCOCET/ROXICET) 5-325 MG tablet Take 1 tablet by mouth every 6 (six) hours as needed for pain.      Allergies:  Allergies as of 01/29/2019  . (No Known Allergies)    ROS:  A 15 point review of systems was performed and pertinent positives and negatives noted in HPI    Objective:     BP (!) 151/60  (BP Location: Right Arm)   Pulse 80   Temp 98.6 F (37 C) (Oral)   Resp 16   Ht 5' 6.5" (1.689 m)   Wt 55.3 kg   SpO2 97%   BMI 19.40 kg/m    Constitutional :  alert, cooperative, appears stated age and no distress  Lymphatics/Throat:  no asymmetry, masses, or scars  Respiratory:  clear to auscultation bilaterally  Cardiovascular:  regular rate and rhythm  Gastrointestinal: soft, non-tender; bowel sounds normal; no masses,  no organomegaly.   Musculoskeletal: Steady gait and movement  Skin: Cool and moist, healing lap incision sites staples intact at umbilical site.  Psychiatric: Normal affect, non-agitated, not confused       LABS:  CMP Latest Ref Rng & Units 01/29/2019  Glucose 70 - 99 mg/dL 111(H)  BUN 8 - 23 mg/dL 16  Creatinine 0.44 - 1.00 mg/dL 0.99  Sodium 135 - 145 mmol/L 130(L)  Potassium 3.5 - 5.1 mmol/L 3.9  Chloride 98 - 111 mmol/L 98  CO2 22 - 32 mmol/L 26  Calcium 8.9 - 10.3 mg/dL 8.0(L)  Total Protein 6.5 - 8.1 g/dL 6.4(L)  Total Bilirubin 0.3 - 1.2 mg/dL 1.5(H)  Alkaline Phos 38 - 126 U/L 426(H)  AST 15 - 41 U/L 79(H)  ALT 0 - 44 U/L 72(H)   CBC Latest Ref Rng & Units 01/29/2019 01/29/2019  WBC 4.0 - 10.5 K/uL 9.1 12.0(H)  Hemoglobin 12.0 - 15.0 g/dL 9.1(L) 10.1(L)  Hematocrit 36.0 -  46.0 % 28.9(L) 31.9(L)  Platelets 150 - 400 K/uL 311 382     RADS: CLINICAL DATA:  83 year old with a recently diagnosed urothelial neoplasm at [facility] in [place]. By report, she underwent laparoscopic surgery for nephrectomy which was aborted due to the findings. She presents now with worsening generalized abdominal pain. She has leukocytosis, elevated lipase, elevated liver function tests, hypoalbuminemia and hypocalcemia on laboratory evaluation.  EXAM: CT ABDOMEN AND PELVIS WITH CONTRAST  TECHNIQUE: Multidetector CT imaging of the abdomen and pelvis was performed using the standard protocol following bolus administration of intravenous contrast.  Delayed imaging through the abdomen and pelvis was also performed.  CONTRAST:  44mL OMNIPAQUE IOHEXOL 300 MG/ML IV. Oral contrast was also administered.  COMPARISON:  No outside imaging is currently available for comparison.  FINDINGS: Lower chest: Small BILATERAL pleural effusions, LEFT greater than RIGHT, with associated passive atelectasis in the lower lobes. Visualized lung bases otherwise clear. Heart size upper normal. No pericardial effusion.  Hepatobiliary: Geographic low attenuation scattered throughout the liver. Approximate 1.3 cm mass involving the LEFT lobe (series 2, image 20). No other discrete hepatic masses. Small gallstones in the gallbladder without CT evidence of acute cholecystitis. No biliary ductal dilation.  Pancreas: Approximate 1.1 x 1.0 cm well-circumscribed simple cyst arising from the proximal tail of the pancreas. No other pancreatic masses. No peripancreatic edema/inflammation.  Spleen: Normal in size and appearance. Small focus of accessory splenic tissue ANTERIOR to the spleen at the hilum.  Adrenals/Urinary Tract: Normal appearing adrenal glands. Large mass with its epicenter in the LEFT renal pelvis extending along the length of the proximal and mid ureter in the LEFT retroperitoneum over a length of at least 15 cm, and perhaps involving the entire ureter. The mass causes marked hydronephrosis involving the LEFT kidney with significant obstruction, as there is delayed contrast excretion by the LEFT kidney and no contrast in the collecting system on the delayed images.  Mild RIGHT hydroureteronephrosis, though there is no obstructing stone or mass in the RIGHT ureter, and the ureter can be followed to the bladder on the delayed images. Adjacent non-obstructing RIGHT LOWER pole renal calculi, the largest measuring approximately 4 mm. RIGHT kidney otherwise normal in appearance.  Filling defect dependently in the urinary bladder on  the delayed images is likely blood clot; this clot is adjacent to the RIGHT ureteral orifice and may account for the mild hydroureteronephrosis.  Stomach/Bowel: Stomach normal in appearance for the degree of distention. Normal-appearing small bowel, with upper normal caliber distal jejunal loops. Marked thickening of the wall of the descending colon, sigmoid colon and rectum. Sigmoid colon diverticulosis. Mobile cecum positioned in the RIGHT UPPER QUADRANT of the abdomen. Lipoma involving the ileocecal valve. Appendix not reliably identified, but no evidence of pericecal inflammation.  Vascular/Lymphatic: Moderate aortoiliofemoral atherosclerosis without evidence of aneurysm. LEFT periaortic retroperitoneal lymphadenopathy adjacent to the urothelial neoplasm, the largest node measuring approximately 1.7 x 1.0 cm. Scattered normal sized lymph nodes elsewhere in the retroperitoneum. No pathologic lymphadenopathy elsewhere.  Reproductive: Normal appearing uterus.  No adnexal masses.  Other: Bilobed fluid collection with an enhancing wall containing gas in the LEFT mid abdomen, adjacent to the mid descending colon, measuring approximately 9 x 7.5 x 9.5 cm. There is high attenuation contrast material within this collection and since there is no oral contrast in the adjacent bowel, I believe this is extravasated vascular contrast from a branch of what I believe is the INFERIOR mesenteric vein.  Fluid collection with enhancing  wall in the dependent portion of the pelvis measuring approximately 5 x 2 x 2 cm.  Musculoskeletal: No acute findings. No evidence of osseous metastatic disease. Degenerative disc disease at L4-5 and L5-S1. Bone islands in the first sacral segment.  IMPRESSION: 1. Large urothelial neoplasm involving the LEFT renal pelvis and the LEFT ureter, likely along its entire length, causing marked LEFT UPPER urinary tract obstruction. Adjacent LEFT  periaortic retroperitoneal metastatic lymphadenopathy. 2. Large abscess in the LEFT mid abdomen, adjacent to the mid descending colon, with extravasated likely vascular contrast within the collection. It would appear that the contrast arises from an INFERIOR mesenteric vessel, likely a branch of the INFERIOR mesenteric vein. 3. Smaller abscess in the dependent portion of the pelvis. 4. Descending and sigmoid colitis and proctitis. Sigmoid colon diverticulosis. 5. Filling defect in the urinary bladder on delayed imaging indicating a blood clot. This is causing partial obstruction of the RIGHT urinary tract, as there is mild hydroureteronephrosis but no obstructing stone or mass. 6. Non-obstructing approximate 4 mm calculus or adjacent calculi in a LOWER pole calyx of the RIGHT kidney. 7. Geographic areas of hepatic steatosis. Indeterminate solitary approximate 1.2 cm lesion involving the LEFT lobe of liver. 8. Approximate 1 cm simple cyst arising from the proximal tail of the pancreas. This is most likely benign, given its appearance. This can be evaluated on follow-up CT examinations during her course of therapy. 9. Cholelithiasis without sonographic evidence of acute cholecystitis. 10. Small BILATERAL pleural effusions, LEFT greater than RIGHT, and associated mild passive atelectasis involving the lower lobes.  Aortic Atherosclerosis (ICD10-170.0)  I would telephoned these results directly to Dr. Burlene Arnt of the emergency department at the time of interpretation on 01/29/2019 at 3:30 p.m.   Electronically Signed   By: Evangeline Dakin M.D.   On: 01/29/2019 15:36   Assessment:      Abdominal pain with CT concerning for multiple intra-abdominal abscess, which 1 may also have a component of active bleeding.  Because of this potentially from recent diagnostic laparoscopy for left urethral cancer.  Plan:     Patient is currently stable, pain controlled, with no signs of  overt sepsis or an acute abdomen or active hemorrhaging.  Serial labs does indicate that her hemoglobin is slowly downtrending but unsure if this is potentially dilutional aspect secondary to IV fluid bolus.  Will admit and monitor with serial abdominal exams, IV antibiotics, and IR consultation to potentially address the fluid collections as noted on CT.  Based on her recent history, immediate surgical intervention at this time has greater risk than the potential benefits.  Recommendations discussed in detail with patient and daughter and both are in agreement with nonsurgical intervention at this time.

## 2019-01-30 ENCOUNTER — Inpatient Hospital Stay: Payer: Medicare HMO

## 2019-01-30 DIAGNOSIS — L8990 Pressure ulcer of unspecified site, unspecified stage: Secondary | ICD-10-CM

## 2019-01-30 HISTORY — PX: OTHER SURGICAL HISTORY: SHX169

## 2019-01-30 LAB — BASIC METABOLIC PANEL
Anion gap: 8 (ref 5–15)
BUN: 16 mg/dL (ref 8–23)
CHLORIDE: 102 mmol/L (ref 98–111)
CO2: 24 mmol/L (ref 22–32)
Calcium: 7.5 mg/dL — ABNORMAL LOW (ref 8.9–10.3)
Creatinine, Ser: 0.91 mg/dL (ref 0.44–1.00)
GFR calc Af Amer: >60 mL/min (ref >60)
GFR calc non Af Amer: 60 mL/min — ABNORMAL LOW (ref >60)
Glucose, Bld: 82 mg/dL (ref 70–99)
Potassium: 3.6 mmol/L (ref 3.5–5.1)
SODIUM: 134 mmol/L — AB (ref 135–145)

## 2019-01-30 LAB — CBC
HCT: 30.3 % — ABNORMAL LOW (ref 36.0–46.0)
Hemoglobin: 9.2 g/dL — ABNORMAL LOW (ref 12.0–15.0)
MCH: 26.9 pg (ref 26.0–34.0)
MCHC: 30.4 g/dL (ref 30.0–36.0)
MCV: 88.6 fL (ref 80.0–100.0)
Platelets: 332 10*3/uL (ref 150–400)
RBC: 3.42 MIL/uL — ABNORMAL LOW (ref 3.87–5.11)
RDW: 14.7 % (ref 11.5–15.5)
WBC: 9.6 10*3/uL (ref 4.0–10.5)
nRBC: 0 % (ref 0.0–0.2)

## 2019-01-30 LAB — MAGNESIUM: Magnesium: 2.1 mg/dL (ref 1.7–2.4)

## 2019-01-30 LAB — URINE CULTURE: CULTURE: NO GROWTH

## 2019-01-30 LAB — PHOSPHORUS: Phosphorus: 3.2 mg/dL (ref 2.5–4.6)

## 2019-01-30 MED ORDER — SODIUM CHLORIDE 0.9% FLUSH
5.0000 mL | Freq: Three times a day (TID) | INTRAVENOUS | Status: DC
  Administered 2019-01-30 – 2019-02-03 (×12): 5 mL

## 2019-01-30 MED ORDER — FENTANYL CITRATE (PF) 100 MCG/2ML IJ SOLN
INTRAMUSCULAR | Status: AC | PRN
  Administered 2019-01-30: 50 ug via INTRAVENOUS

## 2019-01-30 MED ORDER — MIDAZOLAM HCL 5 MG/5ML IJ SOLN
INTRAMUSCULAR | Status: AC
  Filled 2019-01-30: qty 5

## 2019-01-30 MED ORDER — SODIUM CHLORIDE 0.9 % IV SOLN
INTRAVENOUS | Status: DC | PRN
  Administered 2019-01-30 – 2019-02-01 (×6): 250 mL via INTRAVENOUS

## 2019-01-30 MED ORDER — FENTANYL CITRATE (PF) 100 MCG/2ML IJ SOLN
INTRAMUSCULAR | Status: AC
  Filled 2019-01-30: qty 2

## 2019-01-30 MED ORDER — MIDAZOLAM HCL 5 MG/5ML IJ SOLN
INTRAMUSCULAR | Status: AC | PRN
  Administered 2019-01-30: 1 mg via INTRAVENOUS

## 2019-01-30 MED ORDER — ENOXAPARIN SODIUM 40 MG/0.4ML ~~LOC~~ SOLN
40.0000 mg | SUBCUTANEOUS | Status: DC
  Administered 2019-01-31 – 2019-02-03 (×4): 40 mg via SUBCUTANEOUS
  Filled 2019-01-30 (×4): qty 0.4

## 2019-01-30 NOTE — Clinical Social Work Note (Signed)
CSW received consult that patient has questions about her insurance. Please consult financial counseling during regular business hours. CSW is signing off. Please consult should needs arise.  Santiago Bumpers, MSW, Latanya Presser [phone]

## 2019-01-30 NOTE — Progress Notes (Addendum)
Subjective:  CC:  Tonya Crawford is a 83 y.o. female  Hospital stay day 1,   intra-abdominal abscess  HPI: Pt sleeping when entered room  ROS:  Deferred  Objective:      Temp:  [97.7 F (36.5 C)-98.4 F (36.9 C)] 98.1 F (36.7 C) (02/16 2022) Pulse Rate:  [20-89] 89 (02/16 2022) Resp:  [13-22] 16 (02/16 2022) BP: (119-148)/(50-85) 136/50 (02/16 2022) SpO2:  [96 %-100 %] 96 % (02/16 2022) Weight:  [55.3 kg] 55.3 kg (02/16 1600)     Height: 5' 6.5" (168.9 cm) Weight: 55.3 kg BMI (Calculated): 19.39   Intake/Output this shift:   Intake/Output Summary (Last 24 hours) at 01/30/2019 2117 Last data filed at 01/30/2019 1818 Gross per 24 hour  Intake 1538.36 ml  Output 30 ml  Net 1508.36 ml   PE deferred since patient was already asleep  LABS:  CMP Latest Ref Rng & Units 01/30/2019 01/29/2019  Glucose 70 - 99 mg/dL 82 111(H)  BUN 8 - 23 mg/dL 16 16  Creatinine 0.44 - 1.00 mg/dL 0.91 0.99  Sodium 135 - 145 mmol/L 134(L) 130(L)  Potassium 3.5 - 5.1 mmol/L 3.6 3.9  Chloride 98 - 111 mmol/L 102 98  CO2 22 - 32 mmol/L 24 26  Calcium 8.9 - 10.3 mg/dL 7.5(L) 8.0(L)  Total Protein 6.5 - 8.1 g/dL - 6.4(L)  Total Bilirubin 0.3 - 1.2 mg/dL - 1.5(H)  Alkaline Phos 38 - 126 U/L - 426(H)  AST 15 - 41 U/L - 79(H)  ALT 0 - 44 U/L - 72(H)   CBC Latest Ref Rng & Units 01/30/2019 01/29/2019 01/29/2019  WBC 4.0 - 10.5 K/uL 9.6 9.1 12.0(H)  Hemoglobin 12.0 - 15.0 g/dL 9.2(L) 9.1(L) 10.1(L)  Hematocrit 36.0 - 46.0 % 30.3(L) 28.9(L) 31.9(L)  Platelets 150 - 400 K/uL 332 311 382    RADS: INDICATION: 83 year old with a recently diagnosed urothelial neoplasm at [facility] in [place]. By report, she underwent laparoscopic surgery for nephrectomy which was aborted due to the findings. She presented to the [facility] emergency department on 01/29/2019 with CT scan of the abdomen and pelvis demonstrating an indeterminate fluid collection along the left pericolic gutter, worrisome for  postoperative abscess confirmed with subsequent noncontrast CT scan performed 01/30/2019.  Patient presents now for CT-guided percutaneous drainage catheter placement for infection source control purposes.  EXAM: CT IMAGE GUIDED DRAINAGE BY PERCUTANEOUS CATHETER x 2  COMPARISON:  CT abdomen pelvis-01/29/2019; 01/30/2019  MEDICATIONS: The patient is currently admitted to the hospital and receiving intravenous antibiotics. The antibiotics were administered within an appropriate time frame prior to the initiation of the procedure.  ANESTHESIA/SEDATION: Moderate (conscious) sedation was employed during this procedure. A total of Versed 1 mg and Fentanyl 50 mcg was administered intravenously.  Moderate Sedation Time: 26 minutes. The patient's level of consciousness and vital signs were monitored continuously by radiology nursing throughout the procedure under my direct supervision.  CONTRAST:  None  COMPLICATIONS: None immediate.  PROCEDURE: Informed written consent was obtained from the patient after a discussion of the risks, benefits and alternatives to treatment. The patient was placed supine on the CT gantry and a pre procedural CT was performed re-demonstrating the known abscess/fluid collection within the left mid hemiabdomen measuring approximately 7.9 x 5.6 cm (image 58, series 2) and additional component within the motor lower abdomen/pelvis measuring approximately 6.0 x 2.7 cm (image 65, series 2). The procedure was planned. A timeout was performed prior to the initiation of the procedure.  The  skin overlying the left mid and lower abdomen was prepped and draped in the usual sterile fashion. The overlying soft tissues were anesthetized with 1% lidocaine with epinephrine.  18 gauge trocar needles were utilized to coil short Amplatz wires within both collections. Propria position was confirmed with CT imaging.  Initially, the track was dilated  ultimately allowing placement of a 10 [place] all-purpose drainage catheter into the dominant abscess within the left mid hemiabdomen favored to be the collection most proximal to the expected location of enteric leak adjacent to the ascending portion of the duodenum. Following drainage catheter placement approximately 100 cc of purulent material was aspirated  Repeat CT scan was performed demonstrating a persistent collection with the left lower abdomen/pelvis and as such decision was made to place an additional drainage catheter at this location.  Again, the track was dilated allowing placement of a additional 10 French percutaneous drainage catheter. Next, approximately 30 cc of purulent material was aspirated.  Final catheter positioning was confirmed with CT imaging and both catheters were flushed with a small amount of saline.  The dominant collection adjacent to the expected location of the enteric leak was connected to a gravity bag, while the additional catheter located within the lower abdomen/pelvis was connected to a JP bulb.  Dressings were applied. The patient tolerated the procedure well without immediate postprocedural complication.  IMPRESSION: 1. Successful CT guided placement of a 10 [place] all purpose drain catheter into the dominant collection with the left mid hemiabdomen with aspiration of 100 cc of purulent material. This drainage catheter is felt to be located adjacent to the expected location of enteric leak associated with the ascending portion of the duodenum and as such was connected to a gravity bag. 2. Successful CT-guided placement of a 10 French all-purpose drainage catheter into remaining abscess within the left lower abdomen/pelvis with aspiration of 100 cc of purulent material. This drainage catheter was connected to a JP bulb.  PLAN: - Recommend flushing both percutaneous drainage catheters with 10 cc saline per day.  - Maintain  diligent records regarding output from both drainage catheters  - Once the output from one or both drainage catheters has decreased the less than 10 cc per day (excluding flush), repeat CT imaging may be performed as indicated.  - Recommend drainage catheter injection prior to drainage catheter removal.   Electronically Signed   By: Sandi Mariscal M.D.   On: 01/30/2019 17:51  Assessment:   Intra-abdominal abscess, s/p IR guided drainage.  Continue to monitor labs and serial abdominal exams to see if any improvement in symptoms.  Case discussed with the IR provider and he believes that the origin of the abscesses may potentially be proximal small bowel, duodenum due to the timing of the contrast extravasation noted in the earlier CT scan.  If there is increased output from the drain once diet is resumed, this may need to be treated as a fistula.

## 2019-01-30 NOTE — Consult Note (Signed)
Consultation: left hydronephrosis, left renal mass Requested by: Benjamine Sprague, DO  History of Present Illness: 83 yo female s/p aborted left nephro ureterectomy by Dr. Lewie Loron, urologist in [facility] January 13, 2019.  Patient was originally seen by Dr. Lewie Loron last year after July 2019 CT scan showed a left mid ureteral stricture or mass and left hydronephrosis.  From the notes it sounds like she had a stent placed August 2019.  Follow-up Lasix renogram October 2019 showed only 16% function of the left kidney and the stent was removed.  By January 2020 an ultrasound showed likely left renal pelvic mass and hydronephrosis.  She underwent CT scan of the abdomen and pelvis January 05, 2019 which showed a large enhancing mass of the left renal pelvis with severe hydronephrosis involving most of the left ureter with tumor touching the left psoas likely local tumor invasion. She also had 2 enlarged nodes around the left kidney suspected for metastasis.  As above, Dr. Lewie Loron took the patient January 13, 2019 for left nephro ureterectomy and per the op note there was "hard tissue along the pathway of the entire left ureter" precluding dissection of the ureter and hilum.  Therefore he biopsied "periureteral tumor" and pathology returned "invasive high-grade urothelial carcinoma".  In talking with the daughter oncology had seen the patient and was planning some combination of chemotherapy and radiation.  Unfortunately, the patient has continued to decline and with weight loss and abdominal pain, so her daughter brought her to [facility].  She describes the pain around the left ribs as a pressure or a tight band.  She is voiding without difficulty without gross hematuria. CT scan of the abdomen and pelvis here revealed a Bilobed fluid collection with an enhancing wall containing gas in the LEFT mid abdomen, adjacent to the mid descending colon, measuring approximately 9 x 7.5 x 9.5 cm. There is high  attenuation contrast material within this collection and since there is no oral contrast in the adjacent bowel. A repeat CT today confirmed a stable mid-abd fluid collection, so not likely vascular. Possible bowel injury. Fluid collection with enhancing wall in the dependent portion of the pelvis measuring approximately 5 x 2 x 2 cm. As far as the left renal mass it appears as a large mass with its epicenter in the LEFT renal pelvis extending along the length of the proximal and mid ureter in the LEFT retroperitoneum over a length of at least 15 cm, and perhaps involving the entire ureter. The mass causes marked hydronephrosis involving the LEFT kidney with significant obstruction with a LEFT periaortic retroperitoneal lymphadenopathy adjacent to the urothelial neoplasm, the largest node measuring approximately 1.7 x 1.0 cm.  Urology was consulted to see if the patient needed a left nephrostomy tube because of the hydronephrosis and if there were any surgical options for the urothelial carcinoma.    Past Medical History:  Diagnosis Date  . Cancer [facility])    Breast Cancer  . Cancer ([place])    Urothilial Cancer  . Hypertension   . Thyroid disease    History reviewed. No pertinent surgical history.  Home Medications:  Medications Prior to Admission  Medication Sig Dispense Refill Last Dose  . ibuprofen (ADVIL,MOTRIN) 200 MG tablet Take 200 mg by mouth every 6 (six) hours as needed.   01/29/2019 at 0700  . levothyroxine (SYNTHROID, LEVOTHROID) 88 MCG tablet Take 88 mcg by mouth daily.   01/29/2019 at 0700  . lisinopril (PRINIVIL,ZESTRIL) 5 MG tablet Take 5  mg by mouth daily.   01/28/2019 at 0700  . oxyCODONE-acetaminophen (PERCOCET/ROXICET) 5-325 MG tablet Take 1 tablet by mouth every 6 (six) hours as needed for pain.      Allergies: No Known Allergies  No family history on file. Social History:  reports that she has never smoked. She has never used smokeless tobacco. She reports current alcohol  use. No history on file for drug.  ROS: A complete review of systems was performed.  All systems are negative except for pertinent findings as noted. Review of Systems  Constitutional: Positive for malaise/fatigue and weight loss.  All other systems reviewed and are negative.    Physical Exam:  Vital signs in last 24 hours: Temp:  [97.7 F (36.5 C)-98.6 F (37 C)] 98.4 F (36.9 C) (02/16 1159) Pulse Rate:  [69-80] 80 (02/16 1159) Resp:  [12-23] 18 (02/16 1159) BP: (123-151)/(53-65) 148/58 (02/16 1159) SpO2:  [96 %-99 %] 97 % (02/16 1159) General:  Alert and oriented, No acute distress HEENT: Normocephalic, atraumatic Cardiovascular: Regular rate and rhythm Lungs: Regular rate and effort Abdomen: Soft, nontender, nondistended, no abdominal masses Back: mild left CVA tenderness Extremities: No edema Neurologic: Grossly intact  Laboratory Data:  Results for orders placed or performed during the hospital encounter of 01/29/19 (from the past 24 hour(s))  Lactic acid, plasma     Status: None   Collection Time: 01/29/19  4:06 PM  Result Value Ref Range   Lactic Acid, Venous 1.1 0.5 - 1.9 mmol/L  Type and screen [place]     Status: None   Collection Time: 01/29/19  4:08 PM  Result Value Ref Range   ABO/RH(D) A POS    Antibody Screen NEG    Sample Expiration      02/01/2019 Performed at [facility], [address] [place], [place] [address]   Culture, blood (routine x 2)     Status: None (Preliminary result)   Collection Time: 01/29/19  4:08 PM  Result Value Ref Range   Specimen Description BLOOD LFA    Special Requests      BOTTLES DRAWN AEROBIC AND ANAEROBIC Blood Culture results may not be optimal due to an inadequate volume of blood received in culture bottles   Culture      NO GROWTH < 24 HOURS Performed at [facility], [address] [place], [place] [address]    Report Status PENDING   Culture, blood (routine x 2)      Status: None (Preliminary result)   Collection Time: 01/29/19  4:08 PM  Result Value Ref Range   Specimen Description BLOOD RFA    Special Requests      BOTTLES DRAWN AEROBIC AND ANAEROBIC Blood Culture results may not be optimal due to an inadequate volume of blood received in culture bottles   Culture      NO GROWTH < 24 HOURS Performed at [facility], [address], [place], [place] [address]    Report Status PENDING   Protime-INR     Status: None   Collection Time: 01/29/19  4:41 PM  Result Value Ref Range   Prothrombin Time 13.8 11.4 - 15.2 seconds   INR 1.07   CBC with Differential/Platelet     Status: Abnormal   Collection Time: 01/29/19  6:36 PM  Result Value Ref Range   WBC 9.1 4.0 - 10.5 K/uL   RBC 3.30 (L) 3.87 - 5.11 MIL/uL   Hemoglobin 9.1 (L) 12.0 - 15.0 g/dL  HCT 28.9 (L) 36.0 - 46.0 %   MCV 87.6 80.0 - 100.0 fL   MCH 27.6 26.0 - 34.0 pg   MCHC 31.5 30.0 - 36.0 g/dL   RDW 14.6 11.5 - 15.5 %   Platelets 311 150 - 400 K/uL   nRBC 0.0 0.0 - 0.2 %   Neutrophils Relative % 78 %   Neutro Abs 7.1 1.7 - 7.7 K/uL   Lymphocytes Relative 11 %   Lymphs Abs 1.0 0.7 - 4.0 K/uL   Monocytes Relative 9 %   Monocytes Absolute 0.8 0.1 - 1.0 K/uL   Eosinophils Relative 1 %   Eosinophils Absolute 0.1 0.0 - 0.5 K/uL   Basophils Relative 0 %   Basophils Absolute 0.0 0.0 - 0.1 K/uL   Immature Granulocytes 1 %   Abs Immature Granulocytes 0.06 0.00 - 0.07 K/uL  Basic metabolic panel     Status: Abnormal   Collection Time: 01/30/19  4:50 AM  Result Value Ref Range   Sodium 134 (L) 135 - 145 mmol/L   Potassium 3.6 3.5 - 5.1 mmol/L   Chloride 102 98 - 111 mmol/L   CO2 24 22 - 32 mmol/L   Glucose, Bld 82 70 - 99 mg/dL   BUN 16 8 - 23 mg/dL   Creatinine, Ser 0.91 0.44 - 1.00 mg/dL   Calcium 7.5 (L) 8.9 - 10.3 mg/dL   GFR calc non Af Amer 60 (L) >60 mL/min   GFR calc Af Amer >60 >60 mL/min   Anion gap 8 5 - 15  Magnesium     Status: None   Collection Time:  01/30/19  4:50 AM  Result Value Ref Range   Magnesium 2.1 1.7 - 2.4 mg/dL  Phosphorus     Status: None   Collection Time: 01/30/19  4:50 AM  Result Value Ref Range   Phosphorus 3.2 2.5 - 4.6 mg/dL  CBC     Status: Abnormal   Collection Time: 01/30/19  4:50 AM  Result Value Ref Range   WBC 9.6 4.0 - 10.5 K/uL   RBC 3.42 (L) 3.87 - 5.11 MIL/uL   Hemoglobin 9.2 (L) 12.0 - 15.0 g/dL   HCT 30.3 (L) 36.0 - 46.0 %   MCV 88.6 80.0 - 100.0 fL   MCH 26.9 26.0 - 34.0 pg   MCHC 30.4 30.0 - 36.0 g/dL   RDW 14.7 11.5 - 15.5 %   Platelets 332 150 - 400 K/uL   nRBC 0.0 0.0 - 0.2 %   Recent Results (from the past 240 hour(s))  Culture, blood (routine x 2)     Status: None (Preliminary result)   Collection Time: 01/29/19  4:08 PM  Result Value Ref Range Status   Specimen Description BLOOD LFA  Final   Special Requests   Final    BOTTLES DRAWN AEROBIC AND ANAEROBIC Blood Culture results may not be optimal due to an inadequate volume of blood received in culture bottles   Culture   Final    NO GROWTH < 24 HOURS Performed at [facility], [address] [place], [place] [address]    Report Status PENDING  Incomplete  Culture, blood (routine x 2)     Status: None (Preliminary result)   Collection Time: 01/29/19  4:08 PM  Result Value Ref Range Status   Specimen Description BLOOD RFA  Final   Special Requests   Final    BOTTLES DRAWN AEROBIC AND ANAEROBIC Blood Culture results may not be optimal due  to an inadequate volume of blood received in culture bottles   Culture   Final    NO GROWTH < 24 HOURS Performed at [facility], [address] [place], [place] [address]    Report Status PENDING  Incomplete   Creatinine: Recent Labs    01/29/19 1023 01/30/19 0450  CREATININE 0.99 0.91    Impression/Assessment/plan:  1) locally advanced high-grade urothelial carcinoma involving the left ureter likely along its entire length progressing to the left renal pelvis,  causing marked LEFT UPPER urinary tract obstruction/hydronephrosis. Adjacent LEFT periaortic retroperitoneal metastatic lymphadenopathy.  No surgical option at this point and she does NOT need a left nephrostomy. She has normal kidney function and no acute indication to place a drain in the left kidney which would only contaminate the system and likely further spread cancer. I spoke with Dr. Pascal Lux and Dr. Lysle Pearl  It appears the aborted nephro ureterectomy was the right decision at the time and patient needs oncology consult.  This appears to be rapidly progressive tumor and the patient's performance status appears to be dwindling quickly.   2) mild right hydronephrosis and high density material in bladder -mild right hydronephrosis was called on the CT here.  Looking at the images her bladder was quite full and I suspect this may have just been some reflux.  On delayed imaging the ureter appears normal and contrast makes it all the way down to the bladder and on f/u CT today the contrast has drained from the right.  Also I thought the swelling in the right kidney looked improved on the follow-up CT today.  Finally, the patient has normal kidney function. She is not having gross hematuria or trouble voiding.  3) Large abscess in the LEFT mid abdomen, adjacent to the mid descending colon, with extravasated likely vascular contrast within the collection. Management per general surgery.    Festus Aloe 01/30/2019, 12:00 PM

## 2019-01-30 NOTE — Procedures (Signed)
Pre procedural Dx: Post op abscess Post procedural Dx: Same  Technically successful CT guided placed of a 10 Fr drainage catheter placement into the dominant collection within the left mid abdomen yielding 100 cc of purulent, foul smelling fluid.    Technically successful CT guided placed of a 10 Fr drainage catheter placement into remaining fluid collection within the left lower abdomen yielding 30 cc of purulent, foul smelling fluid.    A sample of aspirated fluid was sent to the laboratory for analysis.    EBL: Minimal  Complications: None immediate  Ronny Bacon, MD Pager #: [phone]

## 2019-01-30 NOTE — Progress Notes (Signed)
Pastoral Care Consult   01/30/19 1145  Clinical Encounter Type  Visited With Patient and family together  Visit Type Initial;Spiritual support;Psychological support  Referral From Nurse  Consult/Referral To Chaplain  Spiritual Encounters  Spiritual Needs Prayer  Stress Factors  Patient Stress Factors Health changes  Family Stress Factors Health changes   Pt had requested a visit by Chaplain for Sunday.  Pt and daughter were present and, though were hoping for a [employer], accepted my visit, listen, and pray with them.   Darcey Nora, Chaplain

## 2019-01-30 NOTE — Consult Note (Signed)
Chief Complaint: Postoperative abscess  Referring Physician(s): Lysle Pearl (Surgery)  Patient Status: [facility] - In-pt  History of Present Illness: Tonya Crawford is a 83 y.o. female with past medical history significant for breast cancer and hypertension with recent diagnosis of left-sided urethral cancer, who by report underwent attempted laparoscopy surgery for nephrectomy which was aborted due to invasive findings.  She presented to the [facility] emergency department on 01/29/2019 with CT scan findings worrisome for perioperative abscess along the left paracolic gutter.  Patient presents now for CT-guided percutaneous drainage catheter placement for inspection source control purposes.  The patient is accompanied by her daughter though serves as her own historian.  Patient states she feels unchanged since admission with persistent fatigue and abdominal pain.  She denies chest pain or shortness of breath.  Past Medical History:  Diagnosis Date  . Cancer [facility])    Breast Cancer  . Cancer ([place])    Urothilial Cancer  . Hypertension   . Thyroid disease     History reviewed. No pertinent surgical history.  Allergies: Patient has no known allergies.  Medications: Prior to Admission medications   Medication Sig Start Date End Date Taking? Authorizing Provider  ibuprofen (ADVIL,MOTRIN) 200 MG tablet Take 200 mg by mouth every 6 (six) hours as needed.   Yes [provider]  levothyroxine (SYNTHROID, LEVOTHROID) 88 MCG tablet Take 88 mcg by mouth daily. 01/05/19  Yes [provider]  lisinopril (PRINIVIL,ZESTRIL) 5 MG tablet Take 5 mg by mouth daily. 11/08/18  Yes [provider]  oxyCODONE-acetaminophen (PERCOCET/ROXICET) 5-325 MG tablet Take 1 tablet by mouth every 6 (six) hours as needed for pain. 01/15/19   [provider]     No family history on file.  Social History   Socioeconomic History  . Marital status: Divorced    Spouse name: Not on file  .  Number of children: Not on file  . Years of education: Not on file  . Highest education level: Not on file  Occupational History  . Not on file  Social Needs  . Financial resource strain: Not on file  . Food insecurity:    Worry: Not on file    Inability: Not on file  . Transportation needs:    Medical: Not on file    Non-medical: Not on file  Tobacco Use  . Smoking status: Never Smoker  . Smokeless tobacco: Never Used  Substance and Sexual Activity  . Alcohol use: Yes    Comment: occasionally  . Drug use: Not on file  . Sexual activity: Not on file  Lifestyle  . Physical activity:    Days per week: Not on file    Minutes per session: Not on file  . Stress: Not on file  Relationships  . Social connections:    Talks on phone: Not on file    Gets together: Not on file    Attends religious service: Not on file    Active member of club or organization: Not on file    Attends meetings of clubs or organizations: Not on file    Relationship status: Not on file  Other Topics Concern  . Not on file  Social History Narrative  . Not on file    ECOG Status: 2 - Symptomatic, <50% confined to bed  Review of Systems: A 12 point ROS discussed and pertinent positives are indicated in the HPI above.  All other systems are negative.  Review of Systems  Constitutional: Positive for activity change, fatigue  and fever.  Respiratory: Negative.   Cardiovascular: Negative.   Gastrointestinal: Positive for abdominal pain.    Vital Signs: BP (!) 147/85   Pulse 82   Temp 98.4 F (36.9 C) (Oral)   Resp 17   Ht 5' 6.5" (1.689 m)   Wt 55.3 kg   SpO2 96%   BMI 19.38 kg/m   Physical Exam Vitals signs and nursing note reviewed.  Constitutional:      Appearance: She is well-developed.  Cardiovascular:     Rate and Rhythm: Normal rate and regular rhythm.  Pulmonary:     Effort: Pulmonary effort is normal.     Breath sounds: Normal breath sounds.  Neurological:     Mental Status:  She is alert.  Psychiatric:        Mood and Affect: Mood normal.        Behavior: Behavior normal.     Imaging: Ct Abdomen Pelvis Wo Contrast  Result Date: 01/30/2019 CLINICAL DATA:  83 year old with a recently diagnosed urothelial neoplasm at [facility] in [place]. By report, she underwent laparoscopic surgery for nephrectomy which was aborted due to the findings. She presents now with worsening generalized abdominal pain. She has leukocytosis, elevated lipase, elevated liver function tests, hypoalbuminemia and hypocalcemia on laboratory evaluation. CT scan of the abdomen and pelvis performed 01/29/2019 demonstrates an indeterminate fluid collection along the left pericolic gutter with concern for either vascular or enteric contrast extravasation. Please perform noncontrast abdominal CT for further evaluation of this indeterminate fluid collection. EXAM: CT ABDOMEN AND PELVIS WITHOUT CONTRAST TECHNIQUE: Multidetector CT imaging of the abdomen and pelvis was performed following the standard protocol without IV contrast. COMPARISON:  CT abdomen pelvis-01/29/2019 FINDINGS: Lower chest: Limited visualization the lower thorax demonstrates trace bilateral effusions with associated bibasilar heterogeneous opacities, left greater than right. No discrete focal airspace opacities. Normal heart size. Trace pericardial effusion. There is diffuse decreased attenuation of the intra cardiac blood pool suggestive anemia. Coronary artery calcifications. Hepatobiliary: Known ill-defined hepatic lesions are not well evaluated on present noncontrast examination. Trace amount of perihepatic ascites unchanged. Suspected cholelithiasis. Otherwise, normal noncontrast appearance the gallbladder. Pancreas: Unchanged appearance of the approximately 1.0 cm hypoattenuating lesion within the mid body of the pancreas (image 27, series 2, incompletely characterized on present examination though potentially representative of a  pancreatic cyst/IPMN is not found to demonstrate enhancement contrast-enhanced examination performed day prior. Spleen: Normal noncontrast appearance of the spleen. Adrenals/Urinary Tract: Redemonstrated infiltrative mass involving the left renal pelvis and proximal ureter with associated marked left-sided pelvicaliectasis. Excreted contrast from examination performed day prior remains within the right renal collecting system. No evidence of right-sided urinary obstruction. Minimal amount bilateral perinephric stranding. There is unchanged mild thickening the left mid gland without discrete nodule. Normal noncontrast appearance of the right adrenal gland. Enteric contrast is seen within the urinary bladder. Grossly unchanged appearance of previously identified loculated mixed air and fluid containing collection within the left pericolic gutter with dominant more anteriorly located collection measuring approximately 8.- x 5.9 x 8.9 cm (axial image 45, series 2; 36, series 5) and caudal component measuring approximately 5.5 x 2.6 x 6.8 cm (axial image 51 series 2; 47, series 5), unchanged, previously, 7.8 x 7.7 x 8.6 cm and 5.8 x 2.8 x 6.6 cm respectively. Previously noted high-density material within the collection has dissipated in the interval. Stomach/Bowel: Ingested enteric contrast is now seen extending to the level of the rectum. Grossly unchanged scattered foci of subcutaneous emphysema. No pneumatosis or  portal venous gas. Vascular/Lymphatic: Atherosclerotic plaque within a tortuous but normal caliber abdominal aorta. Reproductive: Normal noncontrast appearance of the pelvic organs. Re-demonstrated approximately 5.3 x 2.2 cm fluid collection with the pelvic cul-de-sac (image 73, series 2). Other: Redemonstrated subcutaneous emphysema about the body wall, presumably the residua of recent attempted laparoscopic intervention. Musculoskeletal: No acute or aggressive osseous abnormalities. Moderate DDD of L5-S1  and to a lesser extent, L4-L5 with disc space height loss, endplate irregularity small posteriorly directed disc osteophyte complexes at these locations. IMPRESSION: 1. Unchanged loculated mixed air and fluid containing collection within the left pericolic gutter with dissipated of previously noted high density material. Given lack of significant increase in size of the collection as well as proximity of the medial aspect of the collection to the ascending portion of the duodenum on preceding examination, the high-density material within the collection is favored to represented enteric contrast. 2. Unchanged appearance of approximately 5.3 x 2.3 cm collection within the pelvic cul-de-sac 3. Unchanged appearance of known infiltrative left-sided renal mass with associated marked upstream pelvicaliectasis. 4. Grossly unchanged suspected hepatic metastasis 5. Additional ancillary findings as above. 6. Aortic Atherosclerosis (ICD10-I70.0). PLAN: - The patient is to undergo CT-guided aspiration/drainage of loculated left-sided pericolic collections for diagnostic and therapeutic purposes. - Appropriateness of proceeding with left-sided percutaneous nephrostomy placement was discussed with on-call urologist, Dr. Junious Silk, and the decision was made NOT to proceed with image guided nephrostomy catheter placement at this time given concern for track seeding of the malignancy. Electronically Signed   By: Sandi Mariscal M.D.   On: 01/30/2019 10:38   Ct Abdomen Pelvis W Contrast  Result Date: 01/29/2019 CLINICAL DATA:  83 year old with a recently diagnosed urothelial neoplasm at [facility] in [place]. By report, she underwent laparoscopic surgery for nephrectomy which was aborted due to the findings. She presents now with worsening generalized abdominal pain. She has leukocytosis, elevated lipase, elevated liver function tests, hypoalbuminemia and hypocalcemia on laboratory evaluation. EXAM: CT ABDOMEN AND PELVIS WITH  CONTRAST TECHNIQUE: Multidetector CT imaging of the abdomen and pelvis was performed using the standard protocol following bolus administration of intravenous contrast. Delayed imaging through the abdomen and pelvis was also performed. CONTRAST:  83mL OMNIPAQUE IOHEXOL 300 MG/ML IV. Oral contrast was also administered. COMPARISON:  No outside imaging is currently available for comparison. FINDINGS: Lower chest: Small BILATERAL pleural effusions, LEFT greater than RIGHT, with associated passive atelectasis in the lower lobes. Visualized lung bases otherwise clear. Heart size upper normal. No pericardial effusion. Hepatobiliary: Geographic low attenuation scattered throughout the liver. Approximate 1.3 cm mass involving the LEFT lobe (series 2, image 20). No other discrete hepatic masses. Small gallstones in the gallbladder without CT evidence of acute cholecystitis. No biliary ductal dilation. Pancreas: Approximate 1.1 x 1.0 cm well-circumscribed simple cyst arising from the proximal tail of the pancreas. No other pancreatic masses. No peripancreatic edema/inflammation. Spleen: Normal in size and appearance. Small focus of accessory splenic tissue ANTERIOR to the spleen at the hilum. Adrenals/Urinary Tract: Normal appearing adrenal glands. Large mass with its epicenter in the LEFT renal pelvis extending along the length of the proximal and mid ureter in the LEFT retroperitoneum over a length of at least 15 cm, and perhaps involving the entire ureter. The mass causes marked hydronephrosis involving the LEFT kidney with significant obstruction, as there is delayed contrast excretion by the LEFT kidney and no contrast in the collecting system on the delayed images. Mild RIGHT hydroureteronephrosis, though there is no obstructing stone or  mass in the RIGHT ureter, and the ureter can be followed to the bladder on the delayed images. Adjacent non-obstructing RIGHT LOWER pole renal calculi, the largest measuring  approximately 4 mm. RIGHT kidney otherwise normal in appearance. Filling defect dependently in the urinary bladder on the delayed images is likely blood clot; this clot is adjacent to the RIGHT ureteral orifice and may account for the mild hydroureteronephrosis. Stomach/Bowel: Stomach normal in appearance for the degree of distention. Normal-appearing small bowel, with upper normal caliber distal jejunal loops. Marked thickening of the wall of the descending colon, sigmoid colon and rectum. Sigmoid colon diverticulosis. Mobile cecum positioned in the RIGHT UPPER QUADRANT of the abdomen. Lipoma involving the ileocecal valve. Appendix not reliably identified, but no evidence of pericecal inflammation. Vascular/Lymphatic: Moderate aortoiliofemoral atherosclerosis without evidence of aneurysm. LEFT periaortic retroperitoneal lymphadenopathy adjacent to the urothelial neoplasm, the largest node measuring approximately 1.7 x 1.0 cm. Scattered normal sized lymph nodes elsewhere in the retroperitoneum. No pathologic lymphadenopathy elsewhere. Reproductive: Normal appearing uterus.  No adnexal masses. Other: Bilobed fluid collection with an enhancing wall containing gas in the LEFT mid abdomen, adjacent to the mid descending colon, measuring approximately 9 x 7.5 x 9.5 cm. There is high attenuation contrast material within this collection and since there is no oral contrast in the adjacent bowel, I believe this is extravasated vascular contrast from a branch of what I believe is the INFERIOR mesenteric vein. Fluid collection with enhancing wall in the dependent portion of the pelvis measuring approximately 5 x 2 x 2 cm. Musculoskeletal: No acute findings. No evidence of osseous metastatic disease. Degenerative disc disease at L4-5 and L5-S1. Bone islands in the first sacral segment. IMPRESSION: 1. Large urothelial neoplasm involving the LEFT renal pelvis and the LEFT ureter, likely along its entire length, causing marked  LEFT UPPER urinary tract obstruction. Adjacent LEFT periaortic retroperitoneal metastatic lymphadenopathy. 2. Large abscess in the LEFT mid abdomen, adjacent to the mid descending colon, with extravasated likely vascular contrast within the collection. It would appear that the contrast arises from an INFERIOR mesenteric vessel, likely a branch of the INFERIOR mesenteric vein. 3. Smaller abscess in the dependent portion of the pelvis. 4. Descending and sigmoid colitis and proctitis. Sigmoid colon diverticulosis. 5. Filling defect in the urinary bladder on delayed imaging indicating a blood clot. This is causing partial obstruction of the RIGHT urinary tract, as there is mild hydroureteronephrosis but no obstructing stone or mass. 6. Non-obstructing approximate 4 mm calculus or adjacent calculi in a LOWER pole calyx of the RIGHT kidney. 7. Geographic areas of hepatic steatosis. Indeterminate solitary approximate 1.2 cm lesion involving the LEFT lobe of liver. 8. Approximate 1 cm simple cyst arising from the proximal tail of the pancreas. This is most likely benign, given its appearance. This can be evaluated on follow-up CT examinations during her course of therapy. 9. Cholelithiasis without sonographic evidence of acute cholecystitis. 10. Small BILATERAL pleural effusions, LEFT greater than RIGHT, and associated mild passive atelectasis involving the lower lobes. Aortic Atherosclerosis (ICD10-170.0) I would telephoned these results directly to Dr. Burlene Arnt of the emergency department at the time of interpretation on 01/29/2019 at 3:30 p.m. Electronically Signed   By: Evangeline Dakin M.D.   On: 01/29/2019 15:36    Labs:  CBC: Recent Labs    01/29/19 1023 01/29/19 1836 01/30/19 0450  WBC 12.0* 9.1 9.6  HGB 10.1* 9.1* 9.2*  HCT 31.9* 28.9* 30.3*  PLT 382 311 332    COAGS: Recent Labs  01/29/19 1641  INR 1.07    BMP: Recent Labs    01/29/19 1023 01/30/19 0450  NA 130* 134*  K 3.9 3.6  CL  98 102  CO2 26 24  GLUCOSE 111* 82  BUN 16 16  CALCIUM 8.0* 7.5*  CREATININE 0.99 0.91  GFRNONAA 54* 60*  GFRAA >60 >60    LIVER FUNCTION TESTS: Recent Labs    01/29/19 1023  BILITOT 1.5*  AST 79*  ALT 72*  ALKPHOS 426*  PROT 6.4*  ALBUMIN 2.2*    TUMOR MARKERS: No results for input(s): AFPTM, CEA, CA199, CHROMGRNA in the last 8760 hours.  Assessment and Plan:  Tonya Crawford is a 83 y.o. female with past medical history significant for breast cancer and hypertension with recent diagnosis of left-sided urethral cancer, who by report underwent attempted laparoscopy surgery for nephrectomy which was aborted due to invasive findings.  She presented to the [facility] emergency department on 01/29/2019 with CT scan findings worrisome for perioperative abscess along the left paracolic gutter.  Patient presents now for CT-guided percutaneous drainage catheter placement for inspection source control purposes.  Risks and benefits CT-guided percutaneous drainage catheter(s) placement was discussed with the patient and the patient's daughter including bleeding, infection, damage to adjacent structures, bowel perforation/fistula connection, and sepsis.  All of the patient's questions were answered, patient is agreeable to proceed.  Consent signed and in chart.   Thank you for this interesting consult.  I greatly enjoyed meeting Kida Digiulio and look forward to participating in their care.  A copy of this report was sent to the requesting provider on this date.  Electronically Signed: Sandi Mariscal, MD 01/30/2019, 4:51 PM   I spent a total of 20 Minutes in face to face in clinical consultation, greater than 50% of which was counseling/coordinating care for CT-guided percutaneous drainage catheter(s) placement

## 2019-01-31 DIAGNOSIS — D649 Anemia, unspecified: Secondary | ICD-10-CM

## 2019-01-31 DIAGNOSIS — C679 Malignant neoplasm of bladder, unspecified: Secondary | ICD-10-CM

## 2019-01-31 DIAGNOSIS — Z853 Personal history of malignant neoplasm of breast: Secondary | ICD-10-CM

## 2019-01-31 LAB — CBC
HCT: 26.8 % — ABNORMAL LOW (ref 36.0–46.0)
Hemoglobin: 8.4 g/dL — ABNORMAL LOW (ref 12.0–15.0)
MCH: 27.3 pg (ref 26.0–34.0)
MCHC: 31.3 g/dL (ref 30.0–36.0)
MCV: 87 fL (ref 80.0–100.0)
Platelets: 305 10*3/uL (ref 150–400)
RBC: 3.08 MIL/uL — ABNORMAL LOW (ref 3.87–5.11)
RDW: 14.7 % (ref 11.5–15.5)
WBC: 6.7 10*3/uL (ref 4.0–10.5)
nRBC: 0 % (ref 0.0–0.2)

## 2019-01-31 LAB — BASIC METABOLIC PANEL
Anion gap: 5 (ref 5–15)
BUN: 16 mg/dL (ref 8–23)
CO2: 27 mmol/L (ref 22–32)
Calcium: 7.4 mg/dL — ABNORMAL LOW (ref 8.9–10.3)
Chloride: 102 mmol/L (ref 98–111)
Creatinine, Ser: 0.85 mg/dL (ref 0.44–1.00)
GFR calc Af Amer: >60 mL/min (ref >60)
GFR calc non Af Amer: >60 mL/min (ref >60)
Glucose, Bld: 89 mg/dL (ref 70–99)
POTASSIUM: 3.5 mmol/L (ref 3.5–5.1)
Sodium: 134 mmol/L — ABNORMAL LOW (ref 135–145)

## 2019-01-31 LAB — MAGNESIUM: Magnesium: 2 mg/dL (ref 1.7–2.4)

## 2019-01-31 LAB — PHOSPHORUS: Phosphorus: 3.2 mg/dL (ref 2.5–4.6)

## 2019-01-31 MED ORDER — KETOROLAC TROMETHAMINE 15 MG/ML IJ SOLN
15.0000 mg | Freq: Three times a day (TID) | INTRAMUSCULAR | Status: DC
  Administered 2019-01-31 – 2019-02-02 (×5): 15 mg via INTRAVENOUS
  Filled 2019-01-31 (×5): qty 1

## 2019-01-31 MED ORDER — KCL IN DEXTROSE-NACL 40-5-0.45 MEQ/L-%-% IV SOLN
INTRAVENOUS | Status: DC
  Administered 2019-01-31 – 2019-02-02 (×4): via INTRAVENOUS
  Filled 2019-01-31 (×5): qty 1000

## 2019-01-31 NOTE — Progress Notes (Signed)
Subjective:  CC:  Tonya Crawford is a 83 y.o. female  Hospital stay day 2,   intra-abdominal abscess  HPI: No issues overnight.  Drain placed and abdominal pain improved.  Complaining of soreness at drain insertion site.  ROS:  Deferred  Objective:      Temp:  [98.1 F (36.7 C)-98.2 F (36.8 C)] 98.1 F (36.7 C) (02/16 2022) Pulse Rate:  [20-89] 89 (02/16 2022) Resp:  [13-22] 16 (02/16 2022) BP: (119-147)/(50-85) 138/70 (02/17 1119) SpO2:  [96 %-100 %] 96 % (02/16 2022) Weight:  [55.3 kg] 55.3 kg (02/16 1600)     Height: 5' 6.5" (168.9 cm) Weight: 55.3 kg BMI (Calculated): 19.39   Intake/Output this shift:   Intake/Output Summary (Last 24 hours) at 01/31/2019 1159 Last data filed at 01/31/2019 0500 Gross per 24 hour  Intake 1776.76 ml  Output 30 ml  Net 1746.76 ml   Proximal drain with bilious output Distal drain with purulent output              Gastrointestinal: soft, minimal tenderness at drain sites, RUQ tenderness improved ; bowel sounds normal; no masses,  no organomegaly.   Musculoskeletal: Steady gait and movement  Skin: Cool and moist,   Psychiatric: Normal affect, non-agitated, not confused        LABS:  CMP Latest Ref Rng & Units 01/31/2019 01/30/2019 01/29/2019  Glucose 70 - 99 mg/dL 89 82 111(H)  BUN 8 - 23 mg/dL 16 16 16   Creatinine 0.44 - 1.00 mg/dL 0.85 0.91 0.99  Sodium 135 - 145 mmol/L 134(L) 134(L) 130(L)  Potassium 3.5 - 5.1 mmol/L 3.5 3.6 3.9  Chloride 98 - 111 mmol/L 102 102 98  CO2 22 - 32 mmol/L 27 24 26   Calcium 8.9 - 10.3 mg/dL 7.4(L) 7.5(L) 8.0(L)  Total Protein 6.5 - 8.1 g/dL - - 6.4(L)  Total Bilirubin 0.3 - 1.2 mg/dL - - 1.5(H)  Alkaline Phos 38 - 126 U/L - - 426(H)  AST 15 - 41 U/L - - 79(H)  ALT 0 - 44 U/L - - 72(H)   CBC Latest Ref Rng & Units 01/31/2019 01/30/2019 01/29/2019  WBC 4.0 - 10.5 K/uL 6.7 9.6 9.1  Hemoglobin 12.0 - 15.0 g/dL 8.4(L) 9.2(L) 9.1(L)  Hematocrit 36.0 - 46.0 % 26.8(L) 30.3(L) 28.9(L)  Platelets 150 -  400 K/uL 305 332 311    RADS:  Assessment:   Intra-abdominal abscess, s/p IR guided drainage.  Continue to monitor labs and serial abdominal exams to see if any improvement in symptoms.  Case discussed with the IR provider and he believes that the origin of the abscesses may potentially be proximal small bowel, duodenum due to the timing of the contrast extravasation noted in the earlier CT scan.  If there is increased output from the drain once diet is resumed, this may need to be treated as a fistula.    NPO for today, continue to monitor output.  May advance diet slowly if clinically stablel starting tomorrow. Nutritionist and ONC consult in the meantime.

## 2019-01-31 NOTE — Progress Notes (Signed)
Referring Physician(s): Dr. Sheppard Penton  Supervising Physician: Aletta Edouard  Patient Status:  Tonya Crawford - In-pt  Chief Complaint: Follow up abdominal abscess drains x 2 placed 2/16 by Dr. Pascal Lux  Subjective:  Patient sleeping upon entry to room, easily arouses to voice cues. Daughter at bedside during exam. Patient reports her right sided abdominal pain has resolved however she continues to have left sided abdominal pain which she describes as soreness and is likely related to the drains. She reports that the higher of the two drains feels like "something is sticking in me," she does not describe the sensation as pain but rather more an awareness that something is inside her abdomen. She does not complain of the same sensation near the lower drain. She reports that she was able to walk some yesterday and has been tolerating a liquid diet thus far. She is hopeful that she can have mashed potatoes today and possibly go home soon.   Daughter reports that JP drain has been emptied 3-4 times since placement and that the gravity bag has been emptied once or twice as it has not had much output. They are both very grateful for the care they have received at the hospital.   Allergies: Patient has no known allergies.  Medications: Prior to Admission medications   Medication Sig Start Date End Date Taking? Authorizing Provider  ibuprofen (ADVIL,MOTRIN) 200 MG tablet Take 200 mg by mouth every 6 (six) hours as needed.   Yes [provider]  levothyroxine (SYNTHROID, LEVOTHROID) 88 MCG tablet Take 88 mcg by mouth daily. 01/05/19  Yes [provider]  lisinopril (PRINIVIL,ZESTRIL) 5 MG tablet Take 5 mg by mouth daily. 11/08/18  Yes [provider]  oxyCODONE-acetaminophen (PERCOCET/ROXICET) 5-325 MG tablet Take 1 tablet by mouth every 6 (six) hours as needed for pain. 01/15/19   [provider]     Vital Signs: BP (!) 136/50 (BP Location: Left Arm)   Pulse 89    Temp 98.1 F (36.7 C) (Oral)   Resp 16   Ht 5' 6.5" (1.689 m)   Wt 121 lb 14.6 oz (55.3 kg)   SpO2 96%   BMI 19.38 kg/m   Physical Exam Vitals signs and nursing note reviewed.  Constitutional:      General: She is not in acute distress. HENT:     Head: Normocephalic.  Cardiovascular:     Rate and Rhythm: Normal rate.  Pulmonary:     Effort: Pulmonary effort is normal.  Abdominal:     General: There is no distension.     Palpations: Abdomen is soft.     Tenderness: There is abdominal tenderness (minimal; over drian inseriton sites only.).     Comments: (+) LLQ drain to JP with scant purulent OP; insertion site clean, dry, dressed appropriately. (+) Left mid drain to gravity with scant purulent OP; insertion site clean, dry, dressed appropriately - this drain is more TTP that lower drain.  Skin:    General: Skin is warm and dry.  Neurological:     Mental Status: She is alert. Mental status is at baseline.     Imaging: Ct Abdomen Pelvis Wo Contrast  Result Date: 01/30/2019 CLINICAL DATA:  83 year old with a recently diagnosed urothelial neoplasm at [facility] in [place]. By report, she underwent laparoscopic surgery for nephrectomy which was aborted due to the findings. She presents now with worsening generalized abdominal pain. She has leukocytosis, elevated lipase, elevated liver function tests, hypoalbuminemia and hypocalcemia on  laboratory evaluation. CT scan of the abdomen and pelvis performed 01/29/2019 demonstrates an indeterminate fluid collection along the left pericolic gutter with concern for either vascular or enteric contrast extravasation. Please perform noncontrast abdominal CT for further evaluation of this indeterminate fluid collection. EXAM: CT ABDOMEN AND PELVIS WITHOUT CONTRAST TECHNIQUE: Multidetector CT imaging of the abdomen and pelvis was performed following the standard protocol without IV contrast. COMPARISON:  CT abdomen pelvis-01/29/2019 FINDINGS:  Lower chest: Limited visualization the lower thorax demonstrates trace bilateral effusions with associated bibasilar heterogeneous opacities, left greater than right. No discrete focal airspace opacities. Normal heart size. Trace pericardial effusion. There is diffuse decreased attenuation of the intra cardiac blood pool suggestive anemia. Coronary artery calcifications. Hepatobiliary: Known ill-defined hepatic lesions are not well evaluated on present noncontrast examination. Trace amount of perihepatic ascites unchanged. Suspected cholelithiasis. Otherwise, normal noncontrast appearance the gallbladder. Pancreas: Unchanged appearance of the approximately 1.0 cm hypoattenuating lesion within the mid body of the pancreas (image 27, series 2, incompletely characterized on present examination though potentially representative of a pancreatic cyst/IPMN is not found to demonstrate enhancement contrast-enhanced examination performed day prior. Spleen: Normal noncontrast appearance of the spleen. Adrenals/Urinary Tract: Redemonstrated infiltrative mass involving the left renal pelvis and proximal ureter with associated marked left-sided pelvicaliectasis. Excreted contrast from examination performed day prior remains within the right renal collecting system. No evidence of right-sided urinary obstruction. Minimal amount bilateral perinephric stranding. There is unchanged mild thickening the left mid gland without discrete nodule. Normal noncontrast appearance of the right adrenal gland. Enteric contrast is seen within the urinary bladder. Grossly unchanged appearance of previously identified loculated mixed air and fluid containing collection within the left pericolic gutter with dominant more anteriorly located collection measuring approximately 8.- x 5.9 x 8.9 cm (axial image 45, series 2; 36, series 5) and caudal component measuring approximately 5.5 x 2.6 x 6.8 cm (axial image 51 series 2; 47, series 5), unchanged,  previously, 7.8 x 7.7 x 8.6 cm and 5.8 x 2.8 x 6.6 cm respectively. Previously noted high-density material within the collection has dissipated in the interval. Stomach/Bowel: Ingested enteric contrast is now seen extending to the level of the rectum. Grossly unchanged scattered foci of subcutaneous emphysema. No pneumatosis or portal venous gas. Vascular/Lymphatic: Atherosclerotic plaque within a tortuous but normal caliber abdominal aorta. Reproductive: Normal noncontrast appearance of the pelvic organs. Re-demonstrated approximately 5.3 x 2.2 cm fluid collection with the pelvic cul-de-sac (image 73, series 2). Other: Redemonstrated subcutaneous emphysema about the body wall, presumably the residua of recent attempted laparoscopic intervention. Musculoskeletal: No acute or aggressive osseous abnormalities. Moderate DDD of L5-S1 and to a lesser extent, L4-L5 with disc space height loss, endplate irregularity small posteriorly directed disc osteophyte complexes at these locations. IMPRESSION: 1. Unchanged loculated mixed air and fluid containing collection within the left pericolic gutter with dissipated of previously noted high density material. Given lack of significant increase in size of the collection as well as proximity of the medial aspect of the collection to the ascending portion of the duodenum on preceding examination, the high-density material within the collection is favored to represented enteric contrast. 2. Unchanged appearance of approximately 5.3 x 2.3 cm collection within the pelvic cul-de-sac 3. Unchanged appearance of known infiltrative left-sided renal mass with associated marked upstream pelvicaliectasis. 4. Grossly unchanged suspected hepatic metastasis 5. Additional ancillary findings as above. 6. Aortic Atherosclerosis (ICD10-I70.0). PLAN: - The patient is to undergo CT-guided aspiration/drainage of loculated left-sided pericolic collections for diagnostic and therapeutic purposes. -  Appropriateness of proceeding with left-sided percutaneous nephrostomy placement was discussed with on-call urologist, Dr. Junious Silk, and the decision was made NOT to proceed with image guided nephrostomy catheter placement at this time given concern for track seeding of the malignancy. Electronically Signed   By: Sandi Mariscal M.D.   On: 01/30/2019 10:38   Ct Abdomen Pelvis W Contrast  Result Date: 01/29/2019 CLINICAL DATA:  83 year old with a recently diagnosed urothelial neoplasm at [facility] in [place]. By report, she underwent laparoscopic surgery for nephrectomy which was aborted due to the findings. She presents now with worsening generalized abdominal pain. She has leukocytosis, elevated lipase, elevated liver function tests, hypoalbuminemia and hypocalcemia on laboratory evaluation. EXAM: CT ABDOMEN AND PELVIS WITH CONTRAST TECHNIQUE: Multidetector CT imaging of the abdomen and pelvis was performed using the standard protocol following bolus administration of intravenous contrast. Delayed imaging through the abdomen and pelvis was also performed. CONTRAST:  73mL OMNIPAQUE IOHEXOL 300 MG/ML IV. Oral contrast was also administered. COMPARISON:  No outside imaging is currently available for comparison. FINDINGS: Lower chest: Small BILATERAL pleural effusions, LEFT greater than RIGHT, with associated passive atelectasis in the lower lobes. Visualized lung bases otherwise clear. Heart size upper normal. No pericardial effusion. Hepatobiliary: Geographic low attenuation scattered throughout the liver. Approximate 1.3 cm mass involving the LEFT lobe (series 2, image 20). No other discrete hepatic masses. Small gallstones in the gallbladder without CT evidence of acute cholecystitis. No biliary ductal dilation. Pancreas: Approximate 1.1 x 1.0 cm well-circumscribed simple cyst arising from the proximal tail of the pancreas. No other pancreatic masses. No peripancreatic edema/inflammation. Spleen: Normal in  size and appearance. Small focus of accessory splenic tissue ANTERIOR to the spleen at the hilum. Adrenals/Urinary Tract: Normal appearing adrenal glands. Large mass with its epicenter in the LEFT renal pelvis extending along the length of the proximal and mid ureter in the LEFT retroperitoneum over a length of at least 15 cm, and perhaps involving the entire ureter. The mass causes marked hydronephrosis involving the LEFT kidney with significant obstruction, as there is delayed contrast excretion by the LEFT kidney and no contrast in the collecting system on the delayed images. Mild RIGHT hydroureteronephrosis, though there is no obstructing stone or mass in the RIGHT ureter, and the ureter can be followed to the bladder on the delayed images. Adjacent non-obstructing RIGHT LOWER pole renal calculi, the largest measuring approximately 4 mm. RIGHT kidney otherwise normal in appearance. Filling defect dependently in the urinary bladder on the delayed images is likely blood clot; this clot is adjacent to the RIGHT ureteral orifice and may account for the mild hydroureteronephrosis. Stomach/Bowel: Stomach normal in appearance for the degree of distention. Normal-appearing small bowel, with upper normal caliber distal jejunal loops. Marked thickening of the wall of the descending colon, sigmoid colon and rectum. Sigmoid colon diverticulosis. Mobile cecum positioned in the RIGHT UPPER QUADRANT of the abdomen. Lipoma involving the ileocecal valve. Appendix not reliably identified, but no evidence of pericecal inflammation. Vascular/Lymphatic: Moderate aortoiliofemoral atherosclerosis without evidence of aneurysm. LEFT periaortic retroperitoneal lymphadenopathy adjacent to the urothelial neoplasm, the largest node measuring approximately 1.7 x 1.0 cm. Scattered normal sized lymph nodes elsewhere in the retroperitoneum. No pathologic lymphadenopathy elsewhere. Reproductive: Normal appearing uterus.  No adnexal masses.  Other: Bilobed fluid collection with an enhancing wall containing gas in the LEFT mid abdomen, adjacent to the mid descending colon, measuring approximately 9 x 7.5 x 9.5 cm. There is high attenuation contrast material within this collection and since  there is no oral contrast in the adjacent bowel, I believe this is extravasated vascular contrast from a branch of what I believe is the INFERIOR mesenteric vein. Fluid collection with enhancing wall in the dependent portion of the pelvis measuring approximately 5 x 2 x 2 cm. Musculoskeletal: No acute findings. No evidence of osseous metastatic disease. Degenerative disc disease at L4-5 and L5-S1. Bone islands in the first sacral segment. IMPRESSION: 1. Large urothelial neoplasm involving the LEFT renal pelvis and the LEFT ureter, likely along its entire length, causing marked LEFT UPPER urinary tract obstruction. Adjacent LEFT periaortic retroperitoneal metastatic lymphadenopathy. 2. Large abscess in the LEFT mid abdomen, adjacent to the mid descending colon, with extravasated likely vascular contrast within the collection. It would appear that the contrast arises from an INFERIOR mesenteric vessel, likely a branch of the INFERIOR mesenteric vein. 3. Smaller abscess in the dependent portion of the pelvis. 4. Descending and sigmoid colitis and proctitis. Sigmoid colon diverticulosis. 5. Filling defect in the urinary bladder on delayed imaging indicating a blood clot. This is causing partial obstruction of the RIGHT urinary tract, as there is mild hydroureteronephrosis but no obstructing stone or mass. 6. Non-obstructing approximate 4 mm calculus or adjacent calculi in a LOWER pole calyx of the RIGHT kidney. 7. Geographic areas of hepatic steatosis. Indeterminate solitary approximate 1.2 cm lesion involving the LEFT lobe of liver. 8. Approximate 1 cm simple cyst arising from the proximal tail of the pancreas. This is most likely benign, given its appearance. This can be  evaluated on follow-up CT examinations during her course of therapy. 9. Cholelithiasis without sonographic evidence of acute cholecystitis. 10. Small BILATERAL pleural effusions, LEFT greater than RIGHT, and associated mild passive atelectasis involving the lower lobes. Aortic Atherosclerosis (ICD10-170.0) I would telephoned these results directly to Dr. Burlene Arnt of the emergency department at the time of interpretation on 01/29/2019 at 3:30 p.m. Electronically Signed   By: Evangeline Dakin M.D.   On: 01/29/2019 15:36   Ct Image Guided Drainage By Percutaneous Catheter  Result Date: 01/30/2019 INDICATION: 83 year old with a recently diagnosed urothelial neoplasm at [facility] in [place]. By report, she underwent laparoscopic surgery for nephrectomy which was aborted due to the findings. She presented to the [facility] emergency department on 01/29/2019 with CT scan of the abdomen and pelvis demonstrating an indeterminate fluid collection along the left pericolic gutter, worrisome for postoperative abscess confirmed with subsequent noncontrast CT scan performed 01/30/2019. Patient presents now for CT-guided percutaneous drainage catheter placement for infection source control purposes. EXAM: CT IMAGE GUIDED DRAINAGE BY PERCUTANEOUS CATHETER x 2 COMPARISON:  CT abdomen pelvis-01/29/2019; 01/30/2019 MEDICATIONS: The patient is currently admitted to the hospital and receiving intravenous antibiotics. The antibiotics were administered within an appropriate time frame prior to the initiation of the procedure. ANESTHESIA/SEDATION: Moderate (conscious) sedation was employed during this procedure. A total of Versed 1 mg and Fentanyl 50 mcg was administered intravenously. Moderate Sedation Time: 26 minutes. The patient's level of consciousness and vital signs were monitored continuously by radiology nursing throughout the procedure under my direct supervision. CONTRAST:  None COMPLICATIONS: None immediate. PROCEDURE:  Informed written consent was obtained from the patient after a discussion of the risks, benefits and alternatives to treatment. The patient was placed supine on the CT gantry and a pre procedural CT was performed re-demonstrating the known abscess/fluid collection within the left mid hemiabdomen measuring approximately 7.9 x 5.6 cm (image 58, series 2) and additional component within the motor lower abdomen/pelvis measuring approximately  6.0 x 2.7 cm (image 65, series 2). The procedure was planned. A timeout was performed prior to the initiation of the procedure. The skin overlying the left mid and lower abdomen was prepped and draped in the usual sterile fashion. The overlying soft tissues were anesthetized with 1% lidocaine with epinephrine. 18 gauge trocar needles were utilized to coil short Amplatz wires within both collections. Propria position was confirmed with CT imaging. Initially, the track was dilated ultimately allowing placement of a 10 [place] all-purpose drainage catheter into the dominant abscess within the left mid hemiabdomen favored to be the collection most proximal to the expected location of enteric leak adjacent to the ascending portion of the duodenum. Following drainage catheter placement approximately 100 cc of purulent material was aspirated Repeat CT scan was performed demonstrating a persistent collection with the left lower abdomen/pelvis and as such decision was made to place an additional drainage catheter at this location. Again, the track was dilated allowing placement of a additional 10 French percutaneous drainage catheter. Next, approximately 30 cc of purulent material was aspirated. Final catheter positioning was confirmed with CT imaging and both catheters were flushed with a small amount of saline. The dominant collection adjacent to the expected location of the enteric leak was connected to a gravity bag, while the additional catheter located within the lower abdomen/pelvis  was connected to a JP bulb. Dressings were applied. The patient tolerated the procedure well without immediate postprocedural complication. IMPRESSION: 1. Successful CT guided placement of a 10 [place] all purpose drain catheter into the dominant collection with the left mid hemiabdomen with aspiration of 100 cc of purulent material. This drainage catheter is felt to be located adjacent to the expected location of enteric leak associated with the ascending portion of the duodenum and as such was connected to a gravity bag. 2. Successful CT-guided placement of a 10 French all-purpose drainage catheter into remaining abscess within the left lower abdomen/pelvis with aspiration of 100 cc of purulent material. This drainage catheter was connected to a JP bulb. PLAN: - Recommend flushing both percutaneous drainage catheters with 10 cc saline per day. - Maintain diligent records regarding output from both drainage catheters - Once the output from one or both drainage catheters has decreased the less than 10 cc per day (excluding flush), repeat CT imaging may be performed as indicated. - Recommend drainage catheter injection prior to drainage catheter removal. Electronically Signed   By: Sandi Mariscal M.D.   On: 01/30/2019 17:51   Ct Image Guided Drainage By Percutaneous Catheter  Result Date: 01/30/2019 INDICATION: 83 year old with a recently diagnosed urothelial neoplasm at [facility] in [place]. By report, she underwent laparoscopic surgery for nephrectomy which was aborted due to the findings. She presented to the [facility] emergency department on 01/29/2019 with CT scan of the abdomen and pelvis demonstrating an indeterminate fluid collection along the left pericolic gutter, worrisome for postoperative abscess confirmed with subsequent noncontrast CT scan performed 01/30/2019. Patient presents now for CT-guided percutaneous drainage catheter placement for infection source control purposes. EXAM: CT IMAGE GUIDED  DRAINAGE BY PERCUTANEOUS CATHETER x 2 COMPARISON:  CT abdomen pelvis-01/29/2019; 01/30/2019 MEDICATIONS: The patient is currently admitted to the hospital and receiving intravenous antibiotics. The antibiotics were administered within an appropriate time frame prior to the initiation of the procedure. ANESTHESIA/SEDATION: Moderate (conscious) sedation was employed during this procedure. A total of Versed 1 mg and Fentanyl 50 mcg was administered intravenously. Moderate Sedation Time: 26 minutes. The patient's level of consciousness  and vital signs were monitored continuously by radiology nursing throughout the procedure under my direct supervision. CONTRAST:  None COMPLICATIONS: None immediate. PROCEDURE: Informed written consent was obtained from the patient after a discussion of the risks, benefits and alternatives to treatment. The patient was placed supine on the CT gantry and a pre procedural CT was performed re-demonstrating the known abscess/fluid collection within the left mid hemiabdomen measuring approximately 7.9 x 5.6 cm (image 58, series 2) and additional component within the motor lower abdomen/pelvis measuring approximately 6.0 x 2.7 cm (image 65, series 2). The procedure was planned. A timeout was performed prior to the initiation of the procedure. The skin overlying the left mid and lower abdomen was prepped and draped in the usual sterile fashion. The overlying soft tissues were anesthetized with 1% lidocaine with epinephrine. 18 gauge trocar needles were utilized to coil short Amplatz wires within both collections. Propria position was confirmed with CT imaging. Initially, the track was dilated ultimately allowing placement of a 10 [place] all-purpose drainage catheter into the dominant abscess within the left mid hemiabdomen favored to be the collection most proximal to the expected location of enteric leak adjacent to the ascending portion of the duodenum. Following drainage catheter placement  approximately 100 cc of purulent material was aspirated Repeat CT scan was performed demonstrating a persistent collection with the left lower abdomen/pelvis and as such decision was made to place an additional drainage catheter at this location. Again, the track was dilated allowing placement of a additional 10 French percutaneous drainage catheter. Next, approximately 30 cc of purulent material was aspirated. Final catheter positioning was confirmed with CT imaging and both catheters were flushed with a small amount of saline. The dominant collection adjacent to the expected location of the enteric leak was connected to a gravity bag, while the additional catheter located within the lower abdomen/pelvis was connected to a JP bulb. Dressings were applied. The patient tolerated the procedure well without immediate postprocedural complication. IMPRESSION: 1. Successful CT guided placement of a 10 [place] all purpose drain catheter into the dominant collection with the left mid hemiabdomen with aspiration of 100 cc of purulent material. This drainage catheter is felt to be located adjacent to the expected location of enteric leak associated with the ascending portion of the duodenum and as such was connected to a gravity bag. 2. Successful CT-guided placement of a 10 French all-purpose drainage catheter into remaining abscess within the left lower abdomen/pelvis with aspiration of 100 cc of purulent material. This drainage catheter was connected to a JP bulb. PLAN: - Recommend flushing both percutaneous drainage catheters with 10 cc saline per day. - Maintain diligent records regarding output from both drainage catheters - Once the output from one or both drainage catheters has decreased the less than 10 cc per day (excluding flush), repeat CT imaging may be performed as indicated. - Recommend drainage catheter injection prior to drainage catheter removal. Electronically Signed   By: Sandi Mariscal M.D.   On: 01/30/2019  17:51    Labs:  CBC: Recent Labs    01/29/19 1023 01/29/19 1836 01/30/19 0450 01/31/19 0536  WBC 12.0* 9.1 9.6 6.7  HGB 10.1* 9.1* 9.2* 8.4*  HCT 31.9* 28.9* 30.3* 26.8*  PLT 382 311 332 305    COAGS: Recent Labs    01/29/19 1641  INR 1.07    BMP: Recent Labs    01/29/19 1023 01/30/19 0450 01/31/19 0536  NA 130* 134* 134*  K 3.9 3.6 3.5  CL  98 102 102  CO2 26 24 27   GLUCOSE 111* 82 89  BUN 16 16 16   CALCIUM 8.0* 7.5* 7.4*  CREATININE 0.99 0.91 0.85  GFRNONAA 54* 60* >60  GFRAA >60 >60 >60    LIVER FUNCTION TESTS: Recent Labs    01/29/19 1023  BILITOT 1.5*  AST 79*  ALT 72*  ALKPHOS 426*  PROT 6.4*  ALBUMIN 2.2*    Assessment and Plan:  83 y/o F s/p aborted left nephro ureterectomy by Dr. Lewie Loron on 12/17/18 who presented to [facility] ED on 01/29/19 with worsening abdominal pain. Imaging studies were worrisome for perioperative abscess along the left paracolic gutter. Abdominal abscess drains x 2 were successfully placed on 2/16 with Dr. Pascal Lux - 100 cc purulent fluid drained from left mid abdominal drain and 30 cc purulent fluid drained from LLQ abdominal drain during insertion.   Patient tolerating PO intake, does have some post procedural soreness at drain insertion sites which is to be expected - no other abdominal pain noted. 30 cc OP recorded in I/O under "inferior LLQ" - no OP recorded for other drain. Afebrile, WBC 6.7, hgb 8.4 (9.2 on 2/16), plt 305. Cultures pending.  Continue TID flushes with 3-5 cc NS, record drain output daily from each drain. Please ensure accurate recording of both drains in Epic to assist with further management.   IR will continue to follow. Please call with questions or concerns.   Electronically Signed: Joaquim Nam, PA-C 01/31/2019, 8:59 AM   I spent a total of 15 Minutes at the the patient's bedside AND on the patient's hospital floor or unit, greater than 50% of which was counseling/coordinating care for follow  up abdominal abscess drains x 2

## 2019-01-31 NOTE — Consult Note (Signed)
[facility]  Date of admission:  01/29/2019  Inpatient day:  01/31/2019  Consulting physician:  Dr Benjamine Sprague  Reason for Consultation:  Urothelial carcinoma  Chief Complaint: Tonya Crawford is a 83 y.o. female with urothelial carcinoma who was admitted through the emergency room with abdominal pain.  HPI:  The patient initially presented with abdominal and flank pain, nausea and vomiting .  Abdominal and pelvic CT at [facility] ER on 07/13/2018 revealed moderate dilatation of the left collecting system and proximal ureter with a hyperdensity narrowing of the mid-reter (stone or soft tissue mass).   She was seen in follow-up by Dr. Lewie Loron, urologist.  She was felt to have a "kink" in the ureter.  A stent was placed on 08//012019 and removed 4 weeks later. Lasix renal scan on 09/14/2018 revealed a minimally functioning left kidney.    Chest, abdomen, and pelvic CT scan on 01/05/2019 revealed a large enhancing solid mass involving the left renal pelvis and left ureter c/w urothelial malignancy.  There was obstruction of the left kidney and severe hydronephrosis.  There was contact with the left psoas muscle and questionable muscle involvement.  Two left pararenal lymph nodes were suspicious for metastasis.  There was concern for portal vein thrombosis involving the anterior brach of the right portal vein  With altered perfusion of the liver.  There was a 7 mm lesion of the right hepatic lobe and a 1 cm pancreatic tail cyst.  She was admitted to [facility] from 01/13/2019 - 01/15/2019.  She was scheduled to have a laparoscopic left nephrectomy by Dr. Darcella Gasman on 01/13/2019.  The procedure was aborted as there was "hard tissue along the entire path of the ureter.  Attempts to get through this between the aorta and the kidney were not feasible due to induration.  The renal vein could not be safely seen due to encapsulation and reactive tissue throughout".  Biopsies of  the lymph nodes revealed invasive high grade urothelial carcinoma within fibroadipose tissue.  Labs at [facility] on 01/14/2019 included a CA27.29 of 17.7 (0-38.6), CA19-9 of 12 (0-35), and LDH of 215 (140-271).  CBC on 01/15/2019 revealed a hematocrit of 37, hemoglobin 12.2, MCV 87, platelets 181,000, and WBC 4200.  Creatinine was 1.32.  Symptomatically, the patient declined after discharge from the hospital.  She was seen in the [facility] ER on 01/29/2019 with increasing abdominal pain and fatigue.  Abdomen and pelvic CT on 01/29/2019 revealed a large urothelial neoplasm involving the LEFT renal pelvis and the LEFT ureter, likely along its entire length, causing marked LEFT UPPER urinary tract obstruction.  There was adjacent LEFT periaortic retroperitoneal metastatic lymphadenopathy (largest 1.7 x 1.0 cm).  There was a 9 x 7.5 x 9.5 cm abscess in the LEFT mid abdomen, adjacent to the mid descending colon, with extravasated likely vascular contrast within the collection.  It appeared that the contrast arises from an INFERIOR mesenteric vein.  There was a 5 x 2 x 2 cm abscess in the dependent portion of the pelvis.  There was descending and sigmoid colitis and proctitis and sigmoid colon diverticulosis.  There was a filling defect in the urinary bladder on delayed imaging indicating a blood clot. This was causing partial obstruction of the RIGHT urinary tract, as there was mild hydroureteronephrosis but no obstructing stone or mass.  There was a 4 mm non-obstructing calculus or adjacent calculi in a LOWER pole calyx of the RIGHT kidney.  There was an indeterminate  solitary 1.2 cm lesion involving the LEFT lobe of liver.  There was a 1 cm simple cyst arising from the proximal tail of the pancreas.  There were small BILATERAL pleural effusions (LEFT > RIGHT), and associated mild passive atelectasis involving the lower lobes.  She underwent CT guided drainage by percutaneous catheter placement on 01/30/2019.   Culture revealed abundant gram positive cocci in pairs and clusters, abundant gram negative rods, and moderate gram positive rods.  Symptomatically, she has had constant pain since placement of the drains.  She is NPO.  She denied any hematuria.  She denies any bone pain.  She has a history of right breast cancer s/p lumpectomy, chemotherapy, and radiation in 2004.  Lumpectomy on 12/12/2003 at [facility] revealed a 2.5 x 1.8 x 1.6 cm grade III invasive ductal carcinoma with focal component of of non-comedo intraductal carcinoma.  Margins were negative.  There were 11 of 20 lymph nodes positive.  The largest lymph node was 2.2 cm.  Tumor was ER+, PR+, and Her2/neu 2+.  Pathologic stage was T2N3Mx.  She is unsure of the chemotherapy that she received.  She received radiation and 5 years of tamoxifen.   Past Medical History:  Diagnosis Date  . Cancer [facility])    Breast Cancer  . Cancer ([place])    Urothilial Cancer  . Hypertension   . Thyroid disease     History reviewed. No pertinent surgical history.  No family history on file.  Social History:  reports that she has never smoked. She has never used smokeless tobacco. She reports current alcohol use. No history on file for drug. She smoked up to 2 packs/day from age [age].  She occasionally drink alcohol.  She denies any exposure to environmental radiation or toxins.  She was a Network engineer.  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  She lives in [place].  She was independent with her ADLs prior to admission.  Her daughter lives in [place].  She plans to live with her daughter.  She is accompanied by her daughter, Sonia Baller, and son-in-law.   Allergies: No Known Allergies  Medications Prior to Admission  Medication Sig Dispense Refill  . ibuprofen (ADVIL,MOTRIN) 200 MG tablet Take 200 mg by mouth every 6 (six) hours as needed.    Marland Kitchen levothyroxine (SYNTHROID, LEVOTHROID) 88 MCG tablet Take 88 mcg by mouth daily.    Marland Kitchen  lisinopril (PRINIVIL,ZESTRIL) 5 MG tablet Take 5 mg by mouth daily.    Marland Kitchen oxyCODONE-acetaminophen (PERCOCET/ROXICET) 5-325 MG tablet Take 1 tablet by mouth every 6 (six) hours as needed for pain.      Review of Systems: GENERAL:  Feels "weak".  No fevers, sweats or weight loss. PERFORMANCE STATUS (ECOG):  1-2 HEENT:  No visual changes, runny nose, sore throat, mouth sores or tenderness. Lungs: No shortness of breath or cough.  No hemoptysis. Cardiac:  No chest pain, palpitations, orthopnea, or PND. GI:  Left sided abdominal pain.  No nausea, vomiting, diarrhea, constipation, melena or hematochezia. GU:  No urgency, frequency, dysuria, or hematuria. Musculoskeletal:  No back pain.  No joint pain.  No muscle tenderness. Extremities:  No pain or swelling. Skin:  No rashes or skin changes. Neuro:  No headache, numbness or weakness, balance or coordination issues. Endocrine:  Thyroid disease on Synthroid.  No diabetes, hot flashes or night sweats. Psych:  No mood changes, depression or anxiety. Pain:  Left sided abdominal pain. Review of systems:  All other systems reviewed and found to be negative.  Physical Exam:  Blood pressure (!) 141/58, pulse 71, temperature 98.2 F (36.8 C), temperature source Oral, resp. rate 16, height 5' 6.5" (1.689 m), weight 121 lb 14.6 oz (55.3 kg), SpO2 97 %.  GENERAL:  Thin pale woman lying comfortably on the medical unit in no acute distress. MENTAL STATUS:  Alert and oriented to person, place and time. HEAD:  Short gray hair.  Normocephalic, atraumatic, face symmetric, no Cushingoid features. EYES:  Brown eyes.  Pupils equal round and reactive to light and accomodation.  No conjunctivitis or scleral icterus. ENT:  Oropharynx clear without lesion.  Tongue normal. Mucous membranes moist.  RESPIRATORY:  Clear to auscultation without rales, wheezes or rhonchi.  Decreased breath sounds at the bases. CARDIOVASCULAR:  Regular rate and rhythm without murmur, rub or  gallop. ABDOMEN:  Two left sided percutaneous drains (one with slightly dark yellow drainage and the other with white drainage).  Soft, tender left side without guarding or rebound tenderness.  Soft bowel sounds and no hepatosplenomegaly.  No masses. SKIN:  No rashes, ulcers or lesions. EXTREMITIES: No edema, no skin discoloration or tenderness.  No palpable cords. LYMPH NODES: No palpable cervical, supraclavicular, axillary or inguinal adenopathy  NEUROLOGICAL: Unremarkable. PSYCH:  Appropriate.   Results for orders placed or performed during the hospital encounter of 01/29/19 (from the past 48 hour(s))  CBC with Differential/Platelet     Status: Abnormal   Collection Time: 01/29/19  6:36 PM  Result Value Ref Range   WBC 9.1 4.0 - 10.5 K/uL   RBC 3.30 (L) 3.87 - 5.11 MIL/uL   Hemoglobin 9.1 (L) 12.0 - 15.0 g/dL   HCT 28.9 (L) 36.0 - 46.0 %   MCV 87.6 80.0 - 100.0 fL   MCH 27.6 26.0 - 34.0 pg   MCHC 31.5 30.0 - 36.0 g/dL   RDW 14.6 11.5 - 15.5 %   Platelets 311 150 - 400 K/uL   nRBC 0.0 0.0 - 0.2 %   Neutrophils Relative % 78 %   Neutro Abs 7.1 1.7 - 7.7 K/uL   Lymphocytes Relative 11 %   Lymphs Abs 1.0 0.7 - 4.0 K/uL   Monocytes Relative 9 %   Monocytes Absolute 0.8 0.1 - 1.0 K/uL   Eosinophils Relative 1 %   Eosinophils Absolute 0.1 0.0 - 0.5 K/uL   Basophils Relative 0 %   Basophils Absolute 0.0 0.0 - 0.1 K/uL   Immature Granulocytes 1 %   Abs Immature Granulocytes 0.06 0.00 - 0.07 K/uL    Comment: Performed at [facility], [address] [place], [place] [address]  Basic metabolic panel     Status: Abnormal   Collection Time: 01/30/19  4:50 AM  Result Value Ref Range   Sodium 134 (L) 135 - 145 mmol/L   Potassium 3.6 3.5 - 5.1 mmol/L   Chloride 102 98 - 111 mmol/L   CO2 24 22 - 32 mmol/L   Glucose, Bld 82 70 - 99 mg/dL   BUN 16 8 - 23 mg/dL   Creatinine, Ser 0.91 0.44 - 1.00 mg/dL   Calcium 7.5 (L) 8.9 - 10.3 mg/dL   GFR calc non Af Amer 60 (L) >60 mL/min    GFR calc Af Amer >60 >60 mL/min   Anion gap 8 5 - 15    Comment: Performed at [facility], [address] [place], [place] [address]  Magnesium     Status: None  Collection Time: 01/30/19  4:50 AM  Result Value Ref Range   Magnesium 2.1 1.7 - 2.4 mg/dL    Comment: Performed at [facility], [address] [place], [place] [address]  Phosphorus     Status: None   Collection Time: 01/30/19  4:50 AM  Result Value Ref Range   Phosphorus 3.2 2.5 - 4.6 mg/dL    Comment: Performed at [facility], [address] [place], [place] [address]  CBC     Status: Abnormal   Collection Time: 01/30/19  4:50 AM  Result Value Ref Range   WBC 9.6 4.0 - 10.5 K/uL   RBC 3.42 (L) 3.87 - 5.11 MIL/uL   Hemoglobin 9.2 (L) 12.0 - 15.0 g/dL   HCT 30.3 (L) 36.0 - 46.0 %   MCV 88.6 80.0 - 100.0 fL   MCH 26.9 26.0 - 34.0 pg   MCHC 30.4 30.0 - 36.0 g/dL   RDW 14.7 11.5 - 15.5 %   Platelets 332 150 - 400 K/uL   nRBC 0.0 0.0 - 0.2 %    Comment: Performed at [facility], [address], [place], [place] [address]  Aerobic/Anaerobic Culture (surgical/deep wound)     Status: None (Preliminary result)   Collection Time: 01/30/19 12:14 PM  Result Value Ref Range   Specimen Description      ABSCESS Performed at [facility], [address], [place], [place] [address]    Special Requests post ct guided drain placement    Gram Stain      ABUNDANT WBC PRESENT, PREDOMINANTLY PMN ABUNDANT GRAM POSITIVE COCCI IN PAIRS IN CLUSTERS ABUNDANT GRAM NEGATIVE RODS MODERATE GRAM POSITIVE RODS    Culture      TOO YOUNG TO READ Performed at [facility], [place] [address] [place], [place] [address]    Report Status PENDING   Basic metabolic panel     Status: Abnormal   Collection Time: 01/31/19  5:36 AM  Result Value Ref Range   Sodium 134 (L) 135 - 145 mmol/L   Potassium 3.5 3.5 - 5.1 mmol/L   Chloride 102 98 - 111 mmol/L   CO2 27 22 - 32 mmol/L   Glucose,  Bld 89 70 - 99 mg/dL   BUN 16 8 - 23 mg/dL   Creatinine, Ser 0.85 0.44 - 1.00 mg/dL   Calcium 7.4 (L) 8.9 - 10.3 mg/dL   GFR calc non Af Amer >60 >60 mL/min   GFR calc Af Amer >60 >60 mL/min   Anion gap 5 5 - 15    Comment: Performed at [facility], [address], [place], [place] [address]  Magnesium     Status: None   Collection Time: 01/31/19  5:36 AM  Result Value Ref Range   Magnesium 2.0 1.7 - 2.4 mg/dL    Comment: Performed at [facility], [address] [place], [place] [address]  Phosphorus     Status: None   Collection Time: 01/31/19  5:36 AM  Result Value Ref Range   Phosphorus 3.2 2.5 - 4.6 mg/dL    Comment: Performed at [facility], [address] [place], [place] [address]  CBC     Status: Abnormal   Collection Time: 01/31/19  5:36 AM  Result Value Ref Range   WBC 6.7 4.0 - 10.5 K/uL   RBC 3.08 (L) 3.87 - 5.11 MIL/uL   Hemoglobin 8.4 (L) 12.0 - 15.0 g/dL   HCT 26.8 (L) 36.0 -  46.0 %   MCV 87.0 80.0 - 100.0 fL   MCH 27.3 26.0 - 34.0 pg   MCHC 31.3 30.0 - 36.0 g/dL   RDW 14.7 11.5 - 15.5 %   Platelets 305 150 - 400 K/uL   nRBC 0.0 0.0 - 0.2 %    Comment: Performed at [facility], [address], [place], [place] [address]   Ct Abdomen Pelvis Wo Contrast  Result Date: 01/30/2019 CLINICAL DATA:  83 year old with a recently diagnosed urothelial neoplasm at [facility] in [place]. By report, she underwent laparoscopic surgery for nephrectomy which was aborted due to the findings. She presents now with worsening generalized abdominal pain. She has leukocytosis, elevated lipase, elevated liver function tests, hypoalbuminemia and hypocalcemia on laboratory evaluation. CT scan of the abdomen and pelvis performed 01/29/2019 demonstrates an indeterminate fluid collection along the left pericolic gutter with concern for either vascular or enteric contrast extravasation. Please perform noncontrast abdominal CT for further  evaluation of this indeterminate fluid collection. EXAM: CT ABDOMEN AND PELVIS WITHOUT CONTRAST TECHNIQUE: Multidetector CT imaging of the abdomen and pelvis was performed following the standard protocol without IV contrast. COMPARISON:  CT abdomen pelvis-01/29/2019 FINDINGS: Lower chest: Limited visualization the lower thorax demonstrates trace bilateral effusions with associated bibasilar heterogeneous opacities, left greater than right. No discrete focal airspace opacities. Normal heart size. Trace pericardial effusion. There is diffuse decreased attenuation of the intra cardiac blood pool suggestive anemia. Coronary artery calcifications. Hepatobiliary: Known ill-defined hepatic lesions are not well evaluated on present noncontrast examination. Trace amount of perihepatic ascites unchanged. Suspected cholelithiasis. Otherwise, normal noncontrast appearance the gallbladder. Pancreas: Unchanged appearance of the approximately 1.0 cm hypoattenuating lesion within the mid body of the pancreas (image 27, series 2, incompletely characterized on present examination though potentially representative of a pancreatic cyst/IPMN is not found to demonstrate enhancement contrast-enhanced examination performed day prior. Spleen: Normal noncontrast appearance of the spleen. Adrenals/Urinary Tract: Redemonstrated infiltrative mass involving the left renal pelvis and proximal ureter with associated marked left-sided pelvicaliectasis. Excreted contrast from examination performed day prior remains within the right renal collecting system. No evidence of right-sided urinary obstruction. Minimal amount bilateral perinephric stranding. There is unchanged mild thickening the left mid gland without discrete nodule. Normal noncontrast appearance of the right adrenal gland. Enteric contrast is seen within the urinary bladder. Grossly unchanged appearance of previously identified loculated mixed air and fluid containing collection within  the left pericolic gutter with dominant more anteriorly located collection measuring approximately 8.- x 5.9 x 8.9 cm (axial image 45, series 2; 36, series 5) and caudal component measuring approximately 5.5 x 2.6 x 6.8 cm (axial image 51 series 2; 47, series 5), unchanged, previously, 7.8 x 7.7 x 8.6 cm and 5.8 x 2.8 x 6.6 cm respectively. Previously noted high-density material within the collection has dissipated in the interval. Stomach/Bowel: Ingested enteric contrast is now seen extending to the level of the rectum. Grossly unchanged scattered foci of subcutaneous emphysema. No pneumatosis or portal venous gas. Vascular/Lymphatic: Atherosclerotic plaque within a tortuous but normal caliber abdominal aorta. Reproductive: Normal noncontrast appearance of the pelvic organs. Re-demonstrated approximately 5.3 x 2.2 cm fluid collection with the pelvic cul-de-sac (image 73, series 2). Other: Redemonstrated subcutaneous emphysema about the body wall, presumably the residua of recent attempted laparoscopic intervention. Musculoskeletal: No acute or aggressive osseous abnormalities. Moderate DDD of L5-S1 and to a lesser extent, L4-L5 with disc space height loss, endplate irregularity small posteriorly directed disc osteophyte complexes at these locations. IMPRESSION:  1. Unchanged loculated mixed air and fluid containing collection within the left pericolic gutter with dissipated of previously noted high density material. Given lack of significant increase in size of the collection as well as proximity of the medial aspect of the collection to the ascending portion of the duodenum on preceding examination, the high-density material within the collection is favored to represented enteric contrast. 2. Unchanged appearance of approximately 5.3 x 2.3 cm collection within the pelvic cul-de-sac 3. Unchanged appearance of known infiltrative left-sided renal mass with associated marked upstream pelvicaliectasis. 4. Grossly  unchanged suspected hepatic metastasis 5. Additional ancillary findings as above. 6. Aortic Atherosclerosis (ICD10-I70.0). PLAN: - The patient is to undergo CT-guided aspiration/drainage of loculated left-sided pericolic collections for diagnostic and therapeutic purposes. - Appropriateness of proceeding with left-sided percutaneous nephrostomy placement was discussed with on-call urologist, Dr. Junious Silk, and the decision was made NOT to proceed with image guided nephrostomy catheter placement at this time given concern for track seeding of the malignancy. Electronically Signed   By: Sandi Mariscal M.D.   On: 01/30/2019 10:38   Ct Image Guided Drainage By Percutaneous Catheter  Result Date: 01/30/2019 INDICATION: 83 year old with a recently diagnosed urothelial neoplasm at [facility] in [place]. By report, she underwent laparoscopic surgery for nephrectomy which was aborted due to the findings. She presented to the [facility] emergency department on 01/29/2019 with CT scan of the abdomen and pelvis demonstrating an indeterminate fluid collection along the left pericolic gutter, worrisome for postoperative abscess confirmed with subsequent noncontrast CT scan performed 01/30/2019. Patient presents now for CT-guided percutaneous drainage catheter placement for infection source control purposes. EXAM: CT IMAGE GUIDED DRAINAGE BY PERCUTANEOUS CATHETER x 2 COMPARISON:  CT abdomen pelvis-01/29/2019; 01/30/2019 MEDICATIONS: The patient is currently admitted to the hospital and receiving intravenous antibiotics. The antibiotics were administered within an appropriate time frame prior to the initiation of the procedure. ANESTHESIA/SEDATION: Moderate (conscious) sedation was employed during this procedure. A total of Versed 1 mg and Fentanyl 50 mcg was administered intravenously. Moderate Sedation Time: 26 minutes. The patient's level of consciousness and vital signs were monitored continuously by radiology nursing  throughout the procedure under my direct supervision. CONTRAST:  None COMPLICATIONS: None immediate. PROCEDURE: Informed written consent was obtained from the patient after a discussion of the risks, benefits and alternatives to treatment. The patient was placed supine on the CT gantry and a pre procedural CT was performed re-demonstrating the known abscess/fluid collection within the left mid hemiabdomen measuring approximately 7.9 x 5.6 cm (image 58, series 2) and additional component within the motor lower abdomen/pelvis measuring approximately 6.0 x 2.7 cm (image 65, series 2). The procedure was planned. A timeout was performed prior to the initiation of the procedure. The skin overlying the left mid and lower abdomen was prepped and draped in the usual sterile fashion. The overlying soft tissues were anesthetized with 1% lidocaine with epinephrine. 18 gauge trocar needles were utilized to coil short Amplatz wires within both collections. Propria position was confirmed with CT imaging. Initially, the track was dilated ultimately allowing placement of a 10 [place] all-purpose drainage catheter into the dominant abscess within the left mid hemiabdomen favored to be the collection most proximal to the expected location of enteric leak adjacent to the ascending portion of the duodenum. Following drainage catheter placement approximately 100 cc of purulent material was aspirated Repeat CT scan was performed demonstrating a persistent collection with the left lower abdomen/pelvis and as such decision was made to place an additional  drainage catheter at this location. Again, the track was dilated allowing placement of a additional 10 French percutaneous drainage catheter. Next, approximately 30 cc of purulent material was aspirated. Final catheter positioning was confirmed with CT imaging and both catheters were flushed with a small amount of saline. The dominant collection adjacent to the expected location of the  enteric leak was connected to a gravity bag, while the additional catheter located within the lower abdomen/pelvis was connected to a JP bulb. Dressings were applied. The patient tolerated the procedure well without immediate postprocedural complication. IMPRESSION: 1. Successful CT guided placement of a 10 [place] all purpose drain catheter into the dominant collection with the left mid hemiabdomen with aspiration of 100 cc of purulent material. This drainage catheter is felt to be located adjacent to the expected location of enteric leak associated with the ascending portion of the duodenum and as such was connected to a gravity bag. 2. Successful CT-guided placement of a 10 French all-purpose drainage catheter into remaining abscess within the left lower abdomen/pelvis with aspiration of 100 cc of purulent material. This drainage catheter was connected to a JP bulb. PLAN: - Recommend flushing both percutaneous drainage catheters with 10 cc saline per day. - Maintain diligent records regarding output from both drainage catheters - Once the output from one or both drainage catheters has decreased the less than 10 cc per day (excluding flush), repeat CT imaging may be performed as indicated. - Recommend drainage catheter injection prior to drainage catheter removal. Electronically Signed   By: Sandi Mariscal M.D.   On: 01/30/2019 17:51   Ct Image Guided Drainage By Percutaneous Catheter  Result Date: 01/30/2019 INDICATION: 83 year old with a recently diagnosed urothelial neoplasm at [facility] in [place]. By report, she underwent laparoscopic surgery for nephrectomy which was aborted due to the findings. She presented to the [facility] emergency department on 01/29/2019 with CT scan of the abdomen and pelvis demonstrating an indeterminate fluid collection along the left pericolic gutter, worrisome for postoperative abscess confirmed with subsequent noncontrast CT scan performed 01/30/2019. Patient presents now  for CT-guided percutaneous drainage catheter placement for infection source control purposes. EXAM: CT IMAGE GUIDED DRAINAGE BY PERCUTANEOUS CATHETER x 2 COMPARISON:  CT abdomen pelvis-01/29/2019; 01/30/2019 MEDICATIONS: The patient is currently admitted to the hospital and receiving intravenous antibiotics. The antibiotics were administered within an appropriate time frame prior to the initiation of the procedure. ANESTHESIA/SEDATION: Moderate (conscious) sedation was employed during this procedure. A total of Versed 1 mg and Fentanyl 50 mcg was administered intravenously. Moderate Sedation Time: 26 minutes. The patient's level of consciousness and vital signs were monitored continuously by radiology nursing throughout the procedure under my direct supervision. CONTRAST:  None COMPLICATIONS: None immediate. PROCEDURE: Informed written consent was obtained from the patient after a discussion of the risks, benefits and alternatives to treatment. The patient was placed supine on the CT gantry and a pre procedural CT was performed re-demonstrating the known abscess/fluid collection within the left mid hemiabdomen measuring approximately 7.9 x 5.6 cm (image 58, series 2) and additional component within the motor lower abdomen/pelvis measuring approximately 6.0 x 2.7 cm (image 65, series 2). The procedure was planned. A timeout was performed prior to the initiation of the procedure. The skin overlying the left mid and lower abdomen was prepped and draped in the usual sterile fashion. The overlying soft tissues were anesthetized with 1% lidocaine with epinephrine. 18 gauge trocar needles were utilized to coil short Amplatz wires within both collections. Propria  position was confirmed with CT imaging. Initially, the track was dilated ultimately allowing placement of a 10 [place] all-purpose drainage catheter into the dominant abscess within the left mid hemiabdomen favored to be the collection most proximal to the expected  location of enteric leak adjacent to the ascending portion of the duodenum. Following drainage catheter placement approximately 100 cc of purulent material was aspirated Repeat CT scan was performed demonstrating a persistent collection with the left lower abdomen/pelvis and as such decision was made to place an additional drainage catheter at this location. Again, the track was dilated allowing placement of a additional 10 French percutaneous drainage catheter. Next, approximately 30 cc of purulent material was aspirated. Final catheter positioning was confirmed with CT imaging and both catheters were flushed with a small amount of saline. The dominant collection adjacent to the expected location of the enteric leak was connected to a gravity bag, while the additional catheter located within the lower abdomen/pelvis was connected to a JP bulb. Dressings were applied. The patient tolerated the procedure well without immediate postprocedural complication. IMPRESSION: 1. Successful CT guided placement of a 10 [place] all purpose drain catheter into the dominant collection with the left mid hemiabdomen with aspiration of 100 cc of purulent material. This drainage catheter is felt to be located adjacent to the expected location of enteric leak associated with the ascending portion of the duodenum and as such was connected to a gravity bag. 2. Successful CT-guided placement of a 10 French all-purpose drainage catheter into remaining abscess within the left lower abdomen/pelvis with aspiration of 100 cc of purulent material. This drainage catheter was connected to a JP bulb. PLAN: - Recommend flushing both percutaneous drainage catheters with 10 cc saline per day. - Maintain diligent records regarding output from both drainage catheters - Once the output from one or both drainage catheters has decreased the less than 10 cc per day (excluding flush), repeat CT imaging may be performed as indicated. - Recommend drainage  catheter injection prior to drainage catheter removal. Electronically Signed   By: Sandi Mariscal M.D.   On: 01/30/2019 17:51    Assessment:  The patient is a 83 y.o. woman with clinical T3N2Mx left urothelial carcinoma s/p aborted nephroureterectomy on 01/13/2019.  Course has been complicated by abscesses requiring surgical drains x 2 on 01/30/2019.  Abdomen and pelvic CT on 01/29/2019 revealed a large urothelial neoplasm involving the LEFT renal pelvis and the LEFT ureter, likely along its entire length of at least 15 cm, causing marked LEFT UPPER urinary tract obstruction.  There was adjacent LEFT periaortic retroperitoneal metastatic lymphadenopathy (largest 1.7 x 1.0 cm).  There was a 9 x 7.5 x 9.5 cm abscess in the LEFT mid abdomen, adjacent to the mid descending colon, with extravasated likely vascular contrast within the collection.  It appeared that the contrast arises from an INFERIOR mesenteric vein.  There was a 5 x 2 x 2 cm abscess in the dependent portion of the pelvis.  There was a filling defect in the urinary bladder on delayed imaging indicating a blood clot. This was causing partial obstruction of the RIGHT urinary tract, as there was mild hydroureteronephrosis but no obstructing stone or mass.  There was a 4 mm non-obstructing calculus or adjacent calculi in a LOWER pole calyx of the RIGHT kidney.  There was an indeterminate solitary 1.2 cm lesion involving the LEFT lobe of liver.  There was a 1 cm simple cyst arising from the proximal tail of the pancreas.  There were small BILATERAL pleural effusions (LEFT > RIGHT), and associated mild passive atelectasis involving the lower lobes.  She is s/p percutaneous abscess drainage x 2 on 02/176/2020.  Cultures reveal GPC, GPR, and GNR.  She is on Zosyn.  She has a history of stage IIIA right breast cancer s/p lumpectomy on 12/12/2003 at [facility].  Pathology revealed a 2.5 x 1.8 x 1.6 cm grade III invasive ductal carcinoma with focal component  of of non-comedo intraductal carcinoma.  Margins were negative.  There were 11 of 20 lymph nodes positive.  The largest lymph node was 2.2 cm.  Tumor was ER+, PR+, and Her2/neu 2+.  Pathologic stage was T2N3Mx.  She received chemotherapy (unknown type), radiation, and 5 years of tamoxifen.   CA27.29 was 17.7 on 01/14/2019.  Symptomatically, she notes left sided abdominal discomfort.  She denies weight loss or bone pain.  Exam reveals 2 percutanous drains.  Plan:   1.  Locally advanced versus metastatic urothelial carcinoma  Reviewed initial presentation and history with patient and her family.  She underwent PET scan in [place] on 01/27/2019.  Will contact Dr Laveda Norman at [facility] [phone]) to obtain PET results and additional information.  Will request Foundation One testing and PDL-1 testing on biopsy material obtained at the time of her surgery.  Discussed treatment for apparent stage IV high grade urothelial carcinoma.  Given her age and renal function, she is not a candidate for cisplatin and gemcitabine.    Discussed carboplatin and gemcitabine if she is able to tolerate chemotherapy after discharge.  Discussed atezolizumab vs pembrolizumab if PDL-1 + or ineligible for a platinum based regimen.  Consider erdafitinib if progresses on platinum based regimen and PDL-1 regimen and FGFR3 or FGFR2 +. 2.  Normocytic anemia  Check iron stores, B12, folate, and retic.  Maintain active type and screen. 3.  Post-operative abdominal abscesses  Percutaneous drains in place.  Patient on Zosyn.   Thank you for allowing me to participate in Tyrika Newman 's care.  I will follow her closely with you while hospitalized and after discharge in the outpatient department.   Lequita Asal, MD  01/31/2019, 4:49 PM

## 2019-02-01 ENCOUNTER — Other Ambulatory Visit: Payer: Self-pay | Admitting: Hematology and Oncology

## 2019-02-01 DIAGNOSIS — C689 Malignant neoplasm of urinary organ, unspecified: Secondary | ICD-10-CM

## 2019-02-01 DIAGNOSIS — E440 Moderate protein-calorie malnutrition: Secondary | ICD-10-CM

## 2019-02-01 LAB — CBC
HCT: 28.1 % — ABNORMAL LOW (ref 36.0–46.0)
Hemoglobin: 8.7 g/dL — ABNORMAL LOW (ref 12.0–15.0)
MCH: 27.4 pg (ref 26.0–34.0)
MCHC: 31 g/dL (ref 30.0–36.0)
MCV: 88.6 fL (ref 80.0–100.0)
Platelets: 324 10*3/uL (ref 150–400)
RBC: 3.17 MIL/uL — ABNORMAL LOW (ref 3.87–5.11)
RDW: 14.6 % (ref 11.5–15.5)
WBC: 6.4 10*3/uL (ref 4.0–10.5)
nRBC: 0 % (ref 0.0–0.2)

## 2019-02-01 LAB — BASIC METABOLIC PANEL
Anion gap: 2 — ABNORMAL LOW (ref 5–15)
BUN: 13 mg/dL (ref 8–23)
CO2: 26 mmol/L (ref 22–32)
Calcium: 7.2 mg/dL — ABNORMAL LOW (ref 8.9–10.3)
Chloride: 106 mmol/L (ref 98–111)
Creatinine, Ser: 0.95 mg/dL (ref 0.44–1.00)
GFR calc Af Amer: >60 mL/min (ref >60)
GFR calc non Af Amer: 57 mL/min — ABNORMAL LOW (ref >60)
Glucose, Bld: 121 mg/dL — ABNORMAL HIGH (ref 70–99)
Potassium: 3.8 mmol/L (ref 3.5–5.1)
Sodium: 134 mmol/L — ABNORMAL LOW (ref 135–145)

## 2019-02-01 LAB — RETICULOCYTES
Immature Retic Fract: 11.5 % (ref 2.3–15.9)
RBC.: 3.17 MIL/uL — ABNORMAL LOW (ref 3.87–5.11)
Retic Count, Absolute: 41.8 10*3/uL (ref 19.0–186.0)
Retic Ct Pct: 1.3 % (ref 0.4–3.1)

## 2019-02-01 LAB — TYPE AND SCREEN
ABO/RH(D): A POS
Antibody Screen: NEGATIVE

## 2019-02-01 LAB — FOLATE: Folate: 7.2 ng/mL (ref >5.9)

## 2019-02-01 LAB — PHOSPHORUS: Phosphorus: 2 mg/dL — ABNORMAL LOW (ref 2.5–4.6)

## 2019-02-01 LAB — IRON AND TIBC
Iron: 13 ug/dL — ABNORMAL LOW (ref 28–170)
Saturation Ratios: 8 % — ABNORMAL LOW (ref 10.4–31.8)
TIBC: 161 ug/dL — ABNORMAL LOW (ref 250–450)
UIBC: 148 ug/dL

## 2019-02-01 LAB — GLUCOSE, CAPILLARY
GLUCOSE-CAPILLARY: 129 mg/dL — AB (ref 70–99)
Glucose-Capillary: 116 mg/dL — ABNORMAL HIGH (ref 70–99)
Glucose-Capillary: 114 mg/dL — ABNORMAL HIGH (ref 70–99)

## 2019-02-01 LAB — VITAMIN B12: Vitamin B-12: 1493 pg/mL — ABNORMAL HIGH (ref 180–914)

## 2019-02-01 LAB — MAGNESIUM: Magnesium: 1.9 mg/dL (ref 1.7–2.4)

## 2019-02-01 LAB — FERRITIN: Ferritin: 634 ng/mL — ABNORMAL HIGH (ref 11–307)

## 2019-02-01 MED ORDER — ENSURE ENLIVE PO LIQD
237.0000 mL | Freq: Two times a day (BID) | ORAL | Status: DC
  Administered 2019-02-02: 237 mL via ORAL

## 2019-02-01 MED ORDER — OCUVITE-LUTEIN PO CAPS
1.0000 | ORAL_CAPSULE | Freq: Every day | ORAL | Status: DC
  Administered 2019-02-01 – 2019-02-02 (×2): 1 via ORAL
  Filled 2019-02-01 (×3): qty 1

## 2019-02-01 NOTE — Progress Notes (Addendum)
Initial Nutrition Assessment  DOCUMENTATION CODES:   Non-severe (moderate) malnutrition in context of chronic illness  INTERVENTION:   Ensure Enlive po BID, each supplement provides 350 kcal and 20 grams of protein  Recommend daily MVI  Pt likely at high refeed risk; recommend monitor K, Mg and P labs daily until stable   NUTRITION DIAGNOSIS:   Moderate Malnutrition related to cancer and cancer related treatments as evidenced by moderate fat depletion, moderate muscle depletion.  GOAL:   Patient will meet greater than or equal to 90% of their needs  MONITOR:   PO intake, Supplement acceptance, Labs, Weight trends, Skin, I & O's  REASON FOR ASSESSMENT:   Consult Assessment of nutrition requirement/status  ASSESSMENT:   83 y/o with h/o urothelial ca, s/p aborted left nephro ureterectomy by Dr. Lewie Loron on 01/13/19, h/o breast cancer s/p chemo and XRT 2004, who presents with perioperative abscess along the left paracolic gutter now s/p abdominal abscess drains x 2 (left mid/lower) which were placed on 2/16 by Dr. Pascal Lux   Met with pt in room today. Pt reports poor appetite and oral intake for the past 2 weeks. Pt reports that she has a very good appetite at baseline but her appetite has been decreased ever since her ex lap r/t abdominal pain and nausea. Pt also reports intermittent diarrhea initially after her surgery. Pt has managed to drink chocolate Ensure and reports eating yogurt and broths at home. Pt reports her abdomen has been distended and she feels pressure under her rib cage that feels like "a really tight bra". Pt reports frequent belching and passing small amounts of flatus that does not relieve her abdominal tightness. Pt has not had a bowel movement for 5 days. Pt reports that she does feel hungry today and reports dry mouth. Pt is willing to drink whatever supplements that we recommend. Pt initiated on clear liquid diet with Ensure today. Suspect pt at high refeed risk;  recommend monitor K, Mg and P labs daily until stable. There is no documented weight history in chart to confirm if any recent weight loss. Pt reports her UBW is ~127lbs; reports "I have never weighed over 134lbs". Pt with 2 drains in place; 123m output noted today. Pt reports that she is planning to mobilize today.   Medications reviewed and include: lovenox, synthroid, protonix, NaCl w/ KCl & 5% dextrose '@75ml' /hr, zosyn   Labs reviewed: Na 134(L), P 2.0(L), Mg 1.9 wnl Iron 13(L), TIBC 161(L), ferritin 634(H), folate 7.2, B12 1493(H) Hgb 8.7(L), Hct 28.1(L)  NUTRITION - FOCUSED PHYSICAL EXAM:    Most Recent Value  Orbital Region  Moderate depletion  Upper Arm Region  Mild depletion  Thoracic and Lumbar Region  Moderate depletion  Buccal Region  Moderate depletion  Temple Region  Moderate depletion  Clavicle Bone Region  Severe depletion  Clavicle and Acromion Bone Region  Severe depletion  Scapular Bone Region  Moderate depletion  Dorsal Hand  No depletion  Patellar Region  Moderate depletion  Anterior Thigh Region  Moderate depletion  Posterior Calf Region  Moderate depletion  Edema (RD Assessment)  None  Hair  Reviewed  Eyes  Reviewed  Mouth  Reviewed  Skin  Reviewed  Nails  Reviewed     Diet Order:   Diet Order            Diet clear liquid Room service appropriate? Yes; Fluid consistency: Thin  Diet effective now  EDUCATION NEEDS:   Education needs have been addressed  Skin:  Skin Assessment: Reviewed RN Assessment(Stage I sacrum, incisions abdomen )  Last BM:  2/13  Height:   Ht Readings from Last 1 Encounters:  01/30/19 5' 6.5" (1.689 m)    Weight:   Wt Readings from Last 1 Encounters:  01/30/19 55.3 kg    Ideal Body Weight:  60 kg  BMI:  Body mass index is 19.38 kg/m.  Estimated Nutritional Needs:   Kcal:  1400-1600kcal/day  Protein:  72-83g/day   Fluid:  1.4L/day   Koleen Distance MS, RD, LDN Pager #- [phone] Office#-  [phone] After Hours Pager: [phone]

## 2019-02-01 NOTE — Progress Notes (Signed)
Referring Physician(s): YTKZS,W  Supervising Physician: Marybelle Killings  Patient Status:  Tonya Crawford - In-pt  Chief Complaint:  Abdominal abscesses  Subjective: Pt still feels a little weak; has not ambulated very much; pain under control; denies N/V   Allergies: Patient has no known allergies.  Medications: Prior to Admission medications   Medication Sig Start Date End Date Taking? Authorizing Provider  ibuprofen (ADVIL,MOTRIN) 200 MG tablet Take 200 mg by mouth every 6 (six) hours as needed.   Yes [provider]  levothyroxine (SYNTHROID, LEVOTHROID) 88 MCG tablet Take 88 mcg by mouth daily. 01/05/19  Yes [provider]  lisinopril (PRINIVIL,ZESTRIL) 5 MG tablet Take 5 mg by mouth daily. 11/08/18  Yes [provider]  oxyCODONE-acetaminophen (PERCOCET/ROXICET) 5-325 MG tablet Take 1 tablet by mouth every 6 (six) hours as needed for pain. 01/15/19   [provider]     Vital Signs: BP (!) 144/62 (BP Location: Right Arm)   Pulse 66   Temp 98 F (36.7 C) (Oral)   Resp 18   Ht 5' 6.5" (1.689 m)   Wt 121 lb 14.6 oz (55.3 kg)   SpO2 98%   BMI 19.38 kg/m   Physical Exam awake/alert; left abd drains intact, minimal  tenderness; outputs left mid 55 cc, left lower 45 cc yellow fluid  Imaging: Ct Abdomen Pelvis Wo Contrast  Result Date: 01/30/2019 CLINICAL DATA:  83 year old with a recently diagnosed urothelial neoplasm at [facility] in [place]. By report, she underwent laparoscopic surgery for nephrectomy which was aborted due to the findings. She presents now with worsening generalized abdominal pain. She has leukocytosis, elevated lipase, elevated liver function tests, hypoalbuminemia and hypocalcemia on laboratory evaluation. CT scan of the abdomen and pelvis performed 01/29/2019 demonstrates an indeterminate fluid collection along the left pericolic gutter with concern for either vascular or enteric contrast extravasation. Please perform  noncontrast abdominal CT for further evaluation of this indeterminate fluid collection. EXAM: CT ABDOMEN AND PELVIS WITHOUT CONTRAST TECHNIQUE: Multidetector CT imaging of the abdomen and pelvis was performed following the standard protocol without IV contrast. COMPARISON:  CT abdomen pelvis-01/29/2019 FINDINGS: Lower chest: Limited visualization the lower thorax demonstrates trace bilateral effusions with associated bibasilar heterogeneous opacities, left greater than right. No discrete focal airspace opacities. Normal heart size. Trace pericardial effusion. There is diffuse decreased attenuation of the intra cardiac blood pool suggestive anemia. Coronary artery calcifications. Hepatobiliary: Known ill-defined hepatic lesions are not well evaluated on present noncontrast examination. Trace amount of perihepatic ascites unchanged. Suspected cholelithiasis. Otherwise, normal noncontrast appearance the gallbladder. Pancreas: Unchanged appearance of the approximately 1.0 cm hypoattenuating lesion within the mid body of the pancreas (image 27, series 2, incompletely characterized on present examination though potentially representative of a pancreatic cyst/IPMN is not found to demonstrate enhancement contrast-enhanced examination performed day prior. Spleen: Normal noncontrast appearance of the spleen. Adrenals/Urinary Tract: Redemonstrated infiltrative mass involving the left renal pelvis and proximal ureter with associated marked left-sided pelvicaliectasis. Excreted contrast from examination performed day prior remains within the right renal collecting system. No evidence of right-sided urinary obstruction. Minimal amount bilateral perinephric stranding. There is unchanged mild thickening the left mid gland without discrete nodule. Normal noncontrast appearance of the right adrenal gland. Enteric contrast is seen within the urinary bladder. Grossly unchanged appearance of previously identified loculated mixed air and  fluid containing collection within the left pericolic gutter with dominant more anteriorly located collection measuring approximately 8.- x 5.9 x 8.9 cm (axial image 45, series 2;  36, series 5) and caudal component measuring approximately 5.5 x 2.6 x 6.8 cm (axial image 51 series 2; 47, series 5), unchanged, previously, 7.8 x 7.7 x 8.6 cm and 5.8 x 2.8 x 6.6 cm respectively. Previously noted high-density material within the collection has dissipated in the interval. Stomach/Bowel: Ingested enteric contrast is now seen extending to the level of the rectum. Grossly unchanged scattered foci of subcutaneous emphysema. No pneumatosis or portal venous gas. Vascular/Lymphatic: Atherosclerotic plaque within a tortuous but normal caliber abdominal aorta. Reproductive: Normal noncontrast appearance of the pelvic organs. Re-demonstrated approximately 5.3 x 2.2 cm fluid collection with the pelvic cul-de-sac (image 73, series 2). Other: Redemonstrated subcutaneous emphysema about the body wall, presumably the residua of recent attempted laparoscopic intervention. Musculoskeletal: No acute or aggressive osseous abnormalities. Moderate DDD of L5-S1 and to a lesser extent, L4-L5 with disc space height loss, endplate irregularity small posteriorly directed disc osteophyte complexes at these locations. IMPRESSION: 1. Unchanged loculated mixed air and fluid containing collection within the left pericolic gutter with dissipated of previously noted high density material. Given lack of significant increase in size of the collection as well as proximity of the medial aspect of the collection to the ascending portion of the duodenum on preceding examination, the high-density material within the collection is favored to represented enteric contrast. 2. Unchanged appearance of approximately 5.3 x 2.3 cm collection within the pelvic cul-de-sac 3. Unchanged appearance of known infiltrative left-sided renal mass with associated marked upstream  pelvicaliectasis. 4. Grossly unchanged suspected hepatic metastasis 5. Additional ancillary findings as above. 6. Aortic Atherosclerosis (ICD10-I70.0). PLAN: - The patient is to undergo CT-guided aspiration/drainage of loculated left-sided pericolic collections for diagnostic and therapeutic purposes. - Appropriateness of proceeding with left-sided percutaneous nephrostomy placement was discussed with on-call urologist, Dr. Junious Silk, and the decision was made NOT to proceed with image guided nephrostomy catheter placement at this time given concern for track seeding of the malignancy. Electronically Signed   By: Sandi Mariscal M.D.   On: 01/30/2019 10:38   Ct Abdomen Pelvis W Contrast  Result Date: 01/29/2019 CLINICAL DATA:  83 year old with a recently diagnosed urothelial neoplasm at [facility] in [place]. By report, she underwent laparoscopic surgery for nephrectomy which was aborted due to the findings. She presents now with worsening generalized abdominal pain. She has leukocytosis, elevated lipase, elevated liver function tests, hypoalbuminemia and hypocalcemia on laboratory evaluation. EXAM: CT ABDOMEN AND PELVIS WITH CONTRAST TECHNIQUE: Multidetector CT imaging of the abdomen and pelvis was performed using the standard protocol following bolus administration of intravenous contrast. Delayed imaging through the abdomen and pelvis was also performed. CONTRAST:  22mL OMNIPAQUE IOHEXOL 300 MG/ML IV. Oral contrast was also administered. COMPARISON:  No outside imaging is currently available for comparison. FINDINGS: Lower chest: Small BILATERAL pleural effusions, LEFT greater than RIGHT, with associated passive atelectasis in the lower lobes. Visualized lung bases otherwise clear. Heart size upper normal. No pericardial effusion. Hepatobiliary: Geographic low attenuation scattered throughout the liver. Approximate 1.3 cm mass involving the LEFT lobe (series 2, image 20). No other discrete hepatic masses.  Small gallstones in the gallbladder without CT evidence of acute cholecystitis. No biliary ductal dilation. Pancreas: Approximate 1.1 x 1.0 cm well-circumscribed simple cyst arising from the proximal tail of the pancreas. No other pancreatic masses. No peripancreatic edema/inflammation. Spleen: Normal in size and appearance. Small focus of accessory splenic tissue ANTERIOR to the spleen at the hilum. Adrenals/Urinary Tract: Normal appearing adrenal glands. Large mass with its epicenter in the  LEFT renal pelvis extending along the length of the proximal and mid ureter in the LEFT retroperitoneum over a length of at least 15 cm, and perhaps involving the entire ureter. The mass causes marked hydronephrosis involving the LEFT kidney with significant obstruction, as there is delayed contrast excretion by the LEFT kidney and no contrast in the collecting system on the delayed images. Mild RIGHT hydroureteronephrosis, though there is no obstructing stone or mass in the RIGHT ureter, and the ureter can be followed to the bladder on the delayed images. Adjacent non-obstructing RIGHT LOWER pole renal calculi, the largest measuring approximately 4 mm. RIGHT kidney otherwise normal in appearance. Filling defect dependently in the urinary bladder on the delayed images is likely blood clot; this clot is adjacent to the RIGHT ureteral orifice and may account for the mild hydroureteronephrosis. Stomach/Bowel: Stomach normal in appearance for the degree of distention. Normal-appearing small bowel, with upper normal caliber distal jejunal loops. Marked thickening of the wall of the descending colon, sigmoid colon and rectum. Sigmoid colon diverticulosis. Mobile cecum positioned in the RIGHT UPPER QUADRANT of the abdomen. Lipoma involving the ileocecal valve. Appendix not reliably identified, but no evidence of pericecal inflammation. Vascular/Lymphatic: Moderate aortoiliofemoral atherosclerosis without evidence of aneurysm. LEFT  periaortic retroperitoneal lymphadenopathy adjacent to the urothelial neoplasm, the largest node measuring approximately 1.7 x 1.0 cm. Scattered normal sized lymph nodes elsewhere in the retroperitoneum. No pathologic lymphadenopathy elsewhere. Reproductive: Normal appearing uterus.  No adnexal masses. Other: Bilobed fluid collection with an enhancing wall containing gas in the LEFT mid abdomen, adjacent to the mid descending colon, measuring approximately 9 x 7.5 x 9.5 cm. There is high attenuation contrast material within this collection and since there is no oral contrast in the adjacent bowel, I believe this is extravasated vascular contrast from a branch of what I believe is the INFERIOR mesenteric vein. Fluid collection with enhancing wall in the dependent portion of the pelvis measuring approximately 5 x 2 x 2 cm. Musculoskeletal: No acute findings. No evidence of osseous metastatic disease. Degenerative disc disease at L4-5 and L5-S1. Bone islands in the first sacral segment. IMPRESSION: 1. Large urothelial neoplasm involving the LEFT renal pelvis and the LEFT ureter, likely along its entire length, causing marked LEFT UPPER urinary tract obstruction. Adjacent LEFT periaortic retroperitoneal metastatic lymphadenopathy. 2. Large abscess in the LEFT mid abdomen, adjacent to the mid descending colon, with extravasated likely vascular contrast within the collection. It would appear that the contrast arises from an INFERIOR mesenteric vessel, likely a branch of the INFERIOR mesenteric vein. 3. Smaller abscess in the dependent portion of the pelvis. 4. Descending and sigmoid colitis and proctitis. Sigmoid colon diverticulosis. 5. Filling defect in the urinary bladder on delayed imaging indicating a blood clot. This is causing partial obstruction of the RIGHT urinary tract, as there is mild hydroureteronephrosis but no obstructing stone or mass. 6. Non-obstructing approximate 4 mm calculus or adjacent calculi in a  LOWER pole calyx of the RIGHT kidney. 7. Geographic areas of hepatic steatosis. Indeterminate solitary approximate 1.2 cm lesion involving the LEFT lobe of liver. 8. Approximate 1 cm simple cyst arising from the proximal tail of the pancreas. This is most likely benign, given its appearance. This can be evaluated on follow-up CT examinations during her course of therapy. 9. Cholelithiasis without sonographic evidence of acute cholecystitis. 10. Small BILATERAL pleural effusions, LEFT greater than RIGHT, and associated mild passive atelectasis involving the lower lobes. Aortic Atherosclerosis (ICD10-170.0) I would telephoned these results directly  to Dr. Burlene Arnt of the emergency department at the time of interpretation on 01/29/2019 at 3:30 p.m. Electronically Signed   By: Evangeline Dakin M.D.   On: 01/29/2019 15:36   Ct Image Guided Drainage By Percutaneous Catheter  Result Date: 01/30/2019 INDICATION: 83 year old with a recently diagnosed urothelial neoplasm at [facility] in [place]. By report, she underwent laparoscopic surgery for nephrectomy which was aborted due to the findings. She presented to the [facility] emergency department on 01/29/2019 with CT scan of the abdomen and pelvis demonstrating an indeterminate fluid collection along the left pericolic gutter, worrisome for postoperative abscess confirmed with subsequent noncontrast CT scan performed 01/30/2019. Patient presents now for CT-guided percutaneous drainage catheter placement for infection source control purposes. EXAM: CT IMAGE GUIDED DRAINAGE BY PERCUTANEOUS CATHETER x 2 COMPARISON:  CT abdomen pelvis-01/29/2019; 01/30/2019 MEDICATIONS: The patient is currently admitted to the hospital and receiving intravenous antibiotics. The antibiotics were administered within an appropriate time frame prior to the initiation of the procedure. ANESTHESIA/SEDATION: Moderate (conscious) sedation was employed during this procedure. A total of Versed 1  mg and Fentanyl 50 mcg was administered intravenously. Moderate Sedation Time: 26 minutes. The patient's level of consciousness and vital signs were monitored continuously by radiology nursing throughout the procedure under my direct supervision. CONTRAST:  None COMPLICATIONS: None immediate. PROCEDURE: Informed written consent was obtained from the patient after a discussion of the risks, benefits and alternatives to treatment. The patient was placed supine on the CT gantry and a pre procedural CT was performed re-demonstrating the known abscess/fluid collection within the left mid hemiabdomen measuring approximately 7.9 x 5.6 cm (image 58, series 2) and additional component within the motor lower abdomen/pelvis measuring approximately 6.0 x 2.7 cm (image 65, series 2). The procedure was planned. A timeout was performed prior to the initiation of the procedure. The skin overlying the left mid and lower abdomen was prepped and draped in the usual sterile fashion. The overlying soft tissues were anesthetized with 1% lidocaine with epinephrine. 18 gauge trocar needles were utilized to coil short Amplatz wires within both collections. Propria position was confirmed with CT imaging. Initially, the track was dilated ultimately allowing placement of a 10 [place] all-purpose drainage catheter into the dominant abscess within the left mid hemiabdomen favored to be the collection most proximal to the expected location of enteric leak adjacent to the ascending portion of the duodenum. Following drainage catheter placement approximately 100 cc of purulent material was aspirated Repeat CT scan was performed demonstrating a persistent collection with the left lower abdomen/pelvis and as such decision was made to place an additional drainage catheter at this location. Again, the track was dilated allowing placement of a additional 10 French percutaneous drainage catheter. Next, approximately 30 cc of purulent material was  aspirated. Final catheter positioning was confirmed with CT imaging and both catheters were flushed with a small amount of saline. The dominant collection adjacent to the expected location of the enteric leak was connected to a gravity bag, while the additional catheter located within the lower abdomen/pelvis was connected to a JP bulb. Dressings were applied. The patient tolerated the procedure well without immediate postprocedural complication. IMPRESSION: 1. Successful CT guided placement of a 10 [place] all purpose drain catheter into the dominant collection with the left mid hemiabdomen with aspiration of 100 cc of purulent material. This drainage catheter is felt to be located adjacent to the expected location of enteric leak associated with the ascending portion of the duodenum and as such  was connected to a gravity bag. 2. Successful CT-guided placement of a 10 French all-purpose drainage catheter into remaining abscess within the left lower abdomen/pelvis with aspiration of 100 cc of purulent material. This drainage catheter was connected to a JP bulb. PLAN: - Recommend flushing both percutaneous drainage catheters with 10 cc saline per day. - Maintain diligent records regarding output from both drainage catheters - Once the output from one or both drainage catheters has decreased the less than 10 cc per day (excluding flush), repeat CT imaging may be performed as indicated. - Recommend drainage catheter injection prior to drainage catheter removal. Electronically Signed   By: Sandi Mariscal M.D.   On: 01/30/2019 17:51   Ct Image Guided Drainage By Percutaneous Catheter  Result Date: 01/30/2019 INDICATION: 83 year old with a recently diagnosed urothelial neoplasm at [facility] in [place]. By report, she underwent laparoscopic surgery for nephrectomy which was aborted due to the findings. She presented to the [facility] emergency department on 01/29/2019 with CT scan of the abdomen and pelvis  demonstrating an indeterminate fluid collection along the left pericolic gutter, worrisome for postoperative abscess confirmed with subsequent noncontrast CT scan performed 01/30/2019. Patient presents now for CT-guided percutaneous drainage catheter placement for infection source control purposes. EXAM: CT IMAGE GUIDED DRAINAGE BY PERCUTANEOUS CATHETER x 2 COMPARISON:  CT abdomen pelvis-01/29/2019; 01/30/2019 MEDICATIONS: The patient is currently admitted to the hospital and receiving intravenous antibiotics. The antibiotics were administered within an appropriate time frame prior to the initiation of the procedure. ANESTHESIA/SEDATION: Moderate (conscious) sedation was employed during this procedure. A total of Versed 1 mg and Fentanyl 50 mcg was administered intravenously. Moderate Sedation Time: 26 minutes. The patient's level of consciousness and vital signs were monitored continuously by radiology nursing throughout the procedure under my direct supervision. CONTRAST:  None COMPLICATIONS: None immediate. PROCEDURE: Informed written consent was obtained from the patient after a discussion of the risks, benefits and alternatives to treatment. The patient was placed supine on the CT gantry and a pre procedural CT was performed re-demonstrating the known abscess/fluid collection within the left mid hemiabdomen measuring approximately 7.9 x 5.6 cm (image 58, series 2) and additional component within the motor lower abdomen/pelvis measuring approximately 6.0 x 2.7 cm (image 65, series 2). The procedure was planned. A timeout was performed prior to the initiation of the procedure. The skin overlying the left mid and lower abdomen was prepped and draped in the usual sterile fashion. The overlying soft tissues were anesthetized with 1% lidocaine with epinephrine. 18 gauge trocar needles were utilized to coil short Amplatz wires within both collections. Propria position was confirmed with CT imaging. Initially, the  track was dilated ultimately allowing placement of a 10 [place] all-purpose drainage catheter into the dominant abscess within the left mid hemiabdomen favored to be the collection most proximal to the expected location of enteric leak adjacent to the ascending portion of the duodenum. Following drainage catheter placement approximately 100 cc of purulent material was aspirated Repeat CT scan was performed demonstrating a persistent collection with the left lower abdomen/pelvis and as such decision was made to place an additional drainage catheter at this location. Again, the track was dilated allowing placement of a additional 10 French percutaneous drainage catheter. Next, approximately 30 cc of purulent material was aspirated. Final catheter positioning was confirmed with CT imaging and both catheters were flushed with a small amount of saline. The dominant collection adjacent to the expected location of the enteric leak was connected to a  gravity bag, while the additional catheter located within the lower abdomen/pelvis was connected to a JP bulb. Dressings were applied. The patient tolerated the procedure well without immediate postprocedural complication. IMPRESSION: 1. Successful CT guided placement of a 10 [place] all purpose drain catheter into the dominant collection with the left mid hemiabdomen with aspiration of 100 cc of purulent material. This drainage catheter is felt to be located adjacent to the expected location of enteric leak associated with the ascending portion of the duodenum and as such was connected to a gravity bag. 2. Successful CT-guided placement of a 10 French all-purpose drainage catheter into remaining abscess within the left lower abdomen/pelvis with aspiration of 100 cc of purulent material. This drainage catheter was connected to a JP bulb. PLAN: - Recommend flushing both percutaneous drainage catheters with 10 cc saline per day. - Maintain diligent records regarding output from  both drainage catheters - Once the output from one or both drainage catheters has decreased the less than 10 cc per day (excluding flush), repeat CT imaging may be performed as indicated. - Recommend drainage catheter injection prior to drainage catheter removal. Electronically Signed   By: Sandi Mariscal M.D.   On: 01/30/2019 17:51    Labs:  CBC: Recent Labs    01/29/19 1836 01/30/19 0450 01/31/19 0536 02/01/19 0407  WBC 9.1 9.6 6.7 6.4  HGB 9.1* 9.2* 8.4* 8.7*  HCT 28.9* 30.3* 26.8* 28.1*  PLT 311 332 305 324    COAGS: Recent Labs    01/29/19 1641  INR 1.07    BMP: Recent Labs    01/29/19 1023 01/30/19 0450 01/31/19 0536 02/01/19 0407  NA 130* 134* 134* 134*  K 3.9 3.6 3.5 3.8  CL 98 102 102 106  CO2 26 24 27 26   GLUCOSE 111* 82 89 121*  BUN 16 16 16 13   CALCIUM 8.0* 7.5* 7.4* 7.2*  CREATININE 0.99 0.91 0.85 0.95  GFRNONAA 54* 60* >60 57*  GFRAA >60 >60 >60 >60    LIVER FUNCTION TESTS: Recent Labs    01/29/19 1023  BILITOT 1.5*  AST 79*  ALT 72*  ALKPHOS 426*  PROT 6.4*  ALBUMIN 2.2*    Assessment and Plan: 83 y/o WF with hx urothelial ca,  s/p aborted left nephro ureterectomy by Dr. Lewie Loron on 12/17/18 who presented to [facility] ED on 01/29/19 with worsening abdominal pain. Imaging studies were worrisome for perioperative abscess along the left paracolic gutter. Abdominal abscess drains x 2 (left mid/lower) were successfully placed on 2/16 by Dr. Pascal Lux; afebrile; WBC nl ; creat nl; hgb 8.7(8.4), drain fluid cx pend; cont drain flushes with sterile NS, output recording;once output minimal (< 10 cc/day excl flush amt) obtain f/u CT to assess adequacy of drainage; rec drain injections prior to removal   Electronically Signed: D. Rowe Robert, PA-C 02/01/2019, 10:42 AM   I spent a total of 15 minutes at the the patient's bedside AND on the patient's hospital floor or unit, greater than 50% of which was counseling/coordinating care for left abdominal abscess  drains    Patient ID: Tonya Crawford, female   DOB: [birth year], 83 y.o.   MRN: [id number]

## 2019-02-01 NOTE — Care Management Important Message (Signed)
Copy of signed Medicare IM left with patient in room. 

## 2019-02-01 NOTE — Progress Notes (Signed)
Subjective:  CC:  Tonya Crawford is a 83 y.o. female  Hospital stay day 3,   intra-abdominal abscess  HPI: No issues overnight.  Pain continues to improve. toradol helped  ROS:  General: Denies weight loss, weight gain, fatigue, fevers, chills, and night sweats. Heart: Denies chest pain, palpitations, racing heart, irregular heartbeat, leg pain or swelling, and decreased activity tolerance. Respiratory: Denies breathing difficulty, shortness of breath, wheezing, cough, and sputum. GI: Denies change in appetite, heartburn, nausea, vomiting, constipation, diarrhea, and blood in stool. GU: Denies difficulty urinating, pain with urinating, urgency, frequency, blood in urine.   Objective:      Temp:  [97.9 F (36.6 C)-98.2 F (36.8 C)] 97.9 F (36.6 C) (02/18 1142) Pulse Rate:  [66-68] 66 (02/18 1142) Resp:  [18] 18 (02/18 1142) BP: (132-144)/(62-64) 132/64 (02/18 1142) SpO2:  [97 %-100 %] 100 % (02/18 1142)     Height: 5' 6.5" (168.9 cm) Weight: 55.3 kg BMI (Calculated): 19.39   Intake/Output this shift:   Intake/Output Summary (Last 24 hours) at 02/01/2019 1539 Last data filed at 02/01/2019 1507 Gross per 24 hour  Intake 430.72 ml  Output 300 ml  Net 130.72 ml   Proximal drain with bilious output Distal drain with purulent output              Gastrointestinal: soft, minimal tenderness at drain sites, RUQ tenderness improved ; bowel sounds normal; no masses,  no organomegaly.   Musculoskeletal: Steady gait and movement  Skin: Cool and moist,   Psychiatric: Normal affect, non-agitated, not confused        LABS:  CMP Latest Ref Rng & Units 02/01/2019 01/31/2019 01/30/2019  Glucose 70 - 99 mg/dL 121(H) 89 82  BUN 8 - 23 mg/dL 13 16 16   Creatinine 0.44 - 1.00 mg/dL 0.95 0.85 0.91  Sodium 135 - 145 mmol/L 134(L) 134(L) 134(L)  Potassium 3.5 - 5.1 mmol/L 3.8 3.5 3.6  Chloride 98 - 111 mmol/L 106 102 102  CO2 22 - 32 mmol/L 26 27 24   Calcium 8.9 - 10.3 mg/dL 7.2(L) 7.4(L)  7.5(L)  Total Protein 6.5 - 8.1 g/dL - - -  Total Bilirubin 0.3 - 1.2 mg/dL - - -  Alkaline Phos 38 - 126 U/L - - -  AST 15 - 41 U/L - - -  ALT 0 - 44 U/L - - -   CBC Latest Ref Rng & Units 02/01/2019 01/31/2019 01/30/2019  WBC 4.0 - 10.5 K/uL 6.4 6.7 9.6  Hemoglobin 12.0 - 15.0 g/dL 8.7(L) 8.4(L) 9.2(L)  Hematocrit 36.0 - 46.0 % 28.1(L) 26.8(L) 30.3(L)  Platelets 150 - 400 K/uL 324 305 332    RADS:  Assessment:   Intra-abdominal abscess, s/p IR guided drainage.  Continue to monitor labs and serial abdominal exams, monitor output as we resume clears today.  Onc note appreciated.  Will discuss port placement, likely as outpt

## 2019-02-02 LAB — CBC
HCT: 29.1 % — ABNORMAL LOW (ref 36.0–46.0)
HEMOGLOBIN: 8.9 g/dL — AB (ref 12.0–15.0)
MCH: 27.2 pg (ref 26.0–34.0)
MCHC: 30.6 g/dL (ref 30.0–36.0)
MCV: 89 fL (ref 80.0–100.0)
Platelets: 342 10*3/uL (ref 150–400)
RBC: 3.27 MIL/uL — ABNORMAL LOW (ref 3.87–5.11)
RDW: 14.6 % (ref 11.5–15.5)
WBC: 5.2 10*3/uL (ref 4.0–10.5)
nRBC: 0 % (ref 0.0–0.2)

## 2019-02-02 LAB — BASIC METABOLIC PANEL
Anion gap: 6 (ref 5–15)
BUN: 10 mg/dL (ref 8–23)
CALCIUM: 7.5 mg/dL — AB (ref 8.9–10.3)
CO2: 24 mmol/L (ref 22–32)
Chloride: 106 mmol/L (ref 98–111)
Creatinine, Ser: 0.94 mg/dL (ref 0.44–1.00)
GFR calc Af Amer: >60 mL/min (ref >60)
GFR calc non Af Amer: 57 mL/min — ABNORMAL LOW (ref >60)
Glucose, Bld: 117 mg/dL — ABNORMAL HIGH (ref 70–99)
Potassium: 4.4 mmol/L (ref 3.5–5.1)
Sodium: 136 mmol/L (ref 135–145)

## 2019-02-02 LAB — MAGNESIUM: Magnesium: 2 mg/dL (ref 1.7–2.4)

## 2019-02-02 LAB — PHOSPHORUS: Phosphorus: 1.7 mg/dL — ABNORMAL LOW (ref 2.5–4.6)

## 2019-02-02 MED ORDER — MAGNESIUM HYDROXIDE 400 MG/5ML PO SUSP
5.0000 mL | Freq: Every day | ORAL | Status: DC
  Administered 2019-02-02: 5 mL via ORAL
  Filled 2019-02-02 (×2): qty 30

## 2019-02-02 MED ORDER — ALUM & MAG HYDROXIDE-SIMETH 200-200-20 MG/5ML PO SUSP
30.0000 mL | ORAL | Status: DC | PRN
  Administered 2019-02-02 – 2019-02-03 (×2): 30 mL via ORAL
  Filled 2019-02-02 (×2): qty 30

## 2019-02-02 MED ORDER — IBUPROFEN 400 MG PO TABS: 600.0000 mg | ORAL_TABLET | Freq: Four times a day (QID) | ORAL | Status: DC | PRN

## 2019-02-02 MED ORDER — POTASSIUM PHOSPHATE MONOBASIC 500 MG PO TABS
1000.0000 mg | ORAL_TABLET | ORAL | Status: AC
  Administered 2019-02-02 (×4): 1000 mg via ORAL
  Filled 2019-02-02 (×4): qty 2

## 2019-02-02 MED ORDER — AMOXICILLIN-POT CLAVULANATE 875-125 MG PO TABS
1.0000 | ORAL_TABLET | Freq: Two times a day (BID) | ORAL | Status: DC
  Administered 2019-02-02 – 2019-02-03 (×3): 1 via ORAL
  Filled 2019-02-02 (×3): qty 1

## 2019-02-02 MED ORDER — PANTOPRAZOLE SODIUM 40 MG PO TBEC
40.0000 mg | DELAYED_RELEASE_TABLET | Freq: Every day | ORAL | Status: DC
  Administered 2019-02-02: 40 mg via ORAL
  Filled 2019-02-02: qty 1

## 2019-02-02 MED ORDER — HYDROCODONE-ACETAMINOPHEN 5-325 MG PO TABS: 1.0000 | ORAL_TABLET | Freq: Four times a day (QID) | ORAL | Status: DC | PRN

## 2019-02-02 NOTE — Progress Notes (Signed)
Subjective:  CC:  Tonya Crawford is a 83 y.o. female  Hospital stay day 4,   intra-abdominal abscess  HPI: No issues overnight.  Tolerated clears. No BM for several days.  ROS:  General: Denies weight loss, weight gain, fatigue, fevers, chills, and night sweats. Heart: Denies chest pain, palpitations, racing heart, irregular heartbeat, leg pain or swelling, and decreased activity tolerance. Respiratory: Denies breathing difficulty, shortness of breath, wheezing, cough, and sputum. GI: Denies change in appetite, heartburn, nausea, vomiting, constipation, diarrhea, and blood in stool. GU: Denies difficulty urinating, pain with urinating, urgency, frequency, blood in urine.   Objective:      Temp:  [97.6 F (36.4 C)-98 F (36.7 C)] 98 F (36.7 C) (02/19 0443) Pulse Rate:  [62-66] 62 (02/19 0443) Resp:  [18-20] 20 (02/19 0443) BP: (132-146)/(62-66) 146/66 (02/19 0443) SpO2:  [98 %-100 %] 99 % (02/19 0443)     Height: 5' 6.5" (168.9 cm) Weight: 55.3 kg BMI (Calculated): 19.39   Intake/Output this shift:   Intake/Output Summary (Last 24 hours) at 02/02/2019 0926 Last data filed at 02/02/2019 0544 Gross per 24 hour  Intake 1537.4 ml  Output 50 ml  Net 1487.4 ml   Proximal drain with bilious output Distal drain with serosanguinous output              Gastrointestinal: soft, minimal tenderness at drain sites, no RUQ pain.   Musculoskeletal: Steady gait and movement  Skin: Cool and moist,   Psychiatric: Normal affect, non-agitated, not confused        LABS:  CMP Latest Ref Rng & Units 02/02/2019 02/01/2019 01/31/2019  Glucose 70 - 99 mg/dL 117(H) 121(H) 89  BUN 8 - 23 mg/dL 10 13 16   Creatinine 0.44 - 1.00 mg/dL 0.94 0.95 0.85  Sodium 135 - 145 mmol/L 136 134(L) 134(L)  Potassium 3.5 - 5.1 mmol/L 4.4 3.8 3.5  Chloride 98 - 111 mmol/L 106 106 102  CO2 22 - 32 mmol/L 24 26 27   Calcium 8.9 - 10.3 mg/dL 7.5(L) 7.2(L) 7.4(L)  Total Protein 6.5 - 8.1 g/dL - - -  Total  Bilirubin 0.3 - 1.2 mg/dL - - -  Alkaline Phos 38 - 126 U/L - - -  AST 15 - 41 U/L - - -  ALT 0 - 44 U/L - - -   CBC Latest Ref Rng & Units 02/02/2019 02/01/2019 01/31/2019  WBC 4.0 - 10.5 K/uL 5.2 6.4 6.7  Hemoglobin 12.0 - 15.0 g/dL 8.9(L) 8.7(L) 8.4(L)  Hematocrit 36.0 - 46.0 % 29.1(L) 28.1(L) 26.8(L)  Platelets 150 - 400 K/uL 342 324 305    RADS: n/a Assessment:   Intra-abdominal abscess, s/p IR guided drainage.  Labs and exam stable after CLD. Advance to regular diet and switch to PO abx.  Will monitor for another 24hrs.  If stable, likely d/c in am and f/u as outpt.    Briefly had discussion of possible port placement if onc recommends chemotherapy.  Ideally, will like to wait until drains removed and abx course is complete

## 2019-02-02 NOTE — Consult Note (Signed)
PHARMACY CONSULT NOTE - FOLLOW UP  Pharmacy Consult for Electrolyte Monitoring and Replacement   Recent Labs: Potassium (mmol/L)  Date Value  02/02/2019 4.4   Magnesium (mg/dL)  Date Value  02/02/2019 2.0   Calcium (mg/dL)  Date Value  02/02/2019 7.5 (L)   Albumin (g/dL)  Date Value  01/29/2019 2.2 (L)   Phosphorus (mg/dL)  Date Value  02/02/2019 1.7 (L)   Sodium (mmol/L)  Date Value  02/02/2019 136     Assessment: Patient is a refeeding risk per dietary consult - will monitor and replete phosphorous daily  Goal of Therapy:  Phosphorous WNL's  Plan:  Will give Potassium Phosphate tabs - 2 po q4 x 4 and recheck potassium and phosphorous with am labs  Lu Duffel ,PharmD Clinical Pharmacist 02/02/2019 9:31 AM

## 2019-02-03 ENCOUNTER — Inpatient Hospital Stay: Payer: Medicare HMO | Attending: Hematology and Oncology

## 2019-02-03 ENCOUNTER — Other Ambulatory Visit: Payer: Self-pay | Admitting: Surgery

## 2019-02-03 DIAGNOSIS — K651 Peritoneal abscess: Secondary | ICD-10-CM

## 2019-02-03 LAB — CULTURE, BLOOD (ROUTINE X 2)
Culture: NO GROWTH
Culture: NO GROWTH

## 2019-02-03 LAB — BASIC METABOLIC PANEL
Anion gap: 5 (ref 5–15)
BUN: 10 mg/dL (ref 8–23)
CO2: 23 mmol/L (ref 22–32)
Calcium: 7.4 mg/dL — ABNORMAL LOW (ref 8.9–10.3)
Chloride: 107 mmol/L (ref 98–111)
Creatinine, Ser: 0.84 mg/dL (ref 0.44–1.00)
Glucose, Bld: 95 mg/dL (ref 70–99)
Potassium: 4.2 mmol/L (ref 3.5–5.1)
SODIUM: 135 mmol/L (ref 135–145)

## 2019-02-03 LAB — CBC
HCT: 29.5 % — ABNORMAL LOW (ref 36.0–46.0)
Hemoglobin: 8.9 g/dL — ABNORMAL LOW (ref 12.0–15.0)
MCH: 27 pg (ref 26.0–34.0)
MCHC: 30.2 g/dL (ref 30.0–36.0)
MCV: 89.4 fL (ref 80.0–100.0)
Platelets: 338 10*3/uL (ref 150–400)
RBC: 3.3 MIL/uL — ABNORMAL LOW (ref 3.87–5.11)
RDW: 14.6 % (ref 11.5–15.5)
WBC: 6.5 10*3/uL (ref 4.0–10.5)
nRBC: 0 % (ref 0.0–0.2)

## 2019-02-03 LAB — MAGNESIUM: MAGNESIUM: 2.1 mg/dL (ref 1.7–2.4)

## 2019-02-03 LAB — PHOSPHORUS: Phosphorus: 2.6 mg/dL (ref 2.5–4.6)

## 2019-02-03 MED ORDER — POLYETHYLENE GLYCOL 3350 17 G PO PACK: 17.0000 g | PACK | Freq: Every day | ORAL | 0 refills | Status: AC

## 2019-02-03 MED ORDER — SODIUM CHLORIDE 0.9% FLUSH: 5.0000 mL | Freq: Three times a day (TID) | INTRAVENOUS | 0 refills | Status: AC

## 2019-02-03 MED ORDER — AMOXICILLIN-POT CLAVULANATE 875-125 MG PO TABS: 1.0000 | ORAL_TABLET | Freq: Two times a day (BID) | ORAL | 0 refills | Status: AC

## 2019-02-03 MED ORDER — OCUVITE-LUTEIN PO CAPS: 1.0000 | ORAL_CAPSULE | Freq: Every day | ORAL | 0 refills | Status: AC

## 2019-02-03 MED ORDER — ENSURE ENLIVE PO LIQD: 237.0000 mL | Freq: Two times a day (BID) | ORAL | 12 refills | Status: AC

## 2019-02-03 MED ORDER — ALUM & MAG HYDROXIDE-SIMETH 200-200-20 MG/5ML PO SUSP: 30.0000 mL | ORAL | 0 refills | Status: AC | PRN

## 2019-02-03 NOTE — Discharge Summary (Signed)
Physician Discharge Summary  Patient ID: Tonya Crawford MRN: [id number] DOB/AGE: Apr 26, 1936 83 y.o.  Admit date: 01/29/2019 Discharge date: 02/03/2019  Admission Diagnoses: intra-abdominal abscess, urothelial cancer  Discharge Diagnoses:  Same as above  Discharged Condition: good  Hospital Course: admitted for above.  S/p IR guided drainage of fluid collections.  Symptoms resolved, tolerated diet without siginificant increase in output, so deemed stable for d/c.  Will continue to monitor closely on outpt basis.  Transfer of care re: urothelial cancer established with Dr. Mike Gip.  Will f/u on outpt basis as well  Consults: hematology/oncology  Discharge Exam: Blood pressure 135/61, pulse 73, temperature 98.6 F (37 C), temperature source Oral, resp. rate 20, height 5' 6.5" (1.689 m), weight 55.3 kg, SpO2 98 %. General appearance: alert, cooperative and no distress GI: soft, non-tender; bowel sounds normal; no masses,  no organomegaly  Disposition:  Discharge disposition: 01-Home or Self Care       Discharge Instructions    Discharge patient   Complete by:  As directed    Discharge disposition:  01-Home or Self Care   Discharge patient date:  02/03/2019     Allergies as of 02/03/2019   No Known Allergies     Medication List    TAKE these medications   alum & mag hydroxide-simeth 200-200-20 MG/5ML suspension Commonly known as:  MAALOX/MYLANTA Take 30 mLs by mouth every 4 (four) hours as needed for indigestion or heartburn.   amoxicillin-clavulanate 875-125 MG tablet Commonly known as:  AUGMENTIN Take 1 tablet by mouth every 12 (twelve) hours for 5 days.   feeding supplement (ENSURE ENLIVE) Liqd Take 237 mLs by mouth 2 (two) times daily between meals.   ibuprofen 200 MG tablet Commonly known as:  ADVIL,MOTRIN Take 200 mg by mouth every 6 (six) hours as needed.   levothyroxine 88 MCG tablet Commonly known as:  SYNTHROID, LEVOTHROID Take 88 mcg by mouth daily.    lisinopril 5 MG tablet Commonly known as:  PRINIVIL,ZESTRIL Take 5 mg by mouth daily.   multivitamin-lutein Caps capsule Take 1 capsule by mouth daily for 30 days.   oxyCODONE-acetaminophen 5-325 MG tablet Commonly known as:  PERCOCET/ROXICET Take 1 tablet by mouth every 6 (six) hours as needed for pain.   polyethylene glycol packet Commonly known as:  MIRALAX Take 17 g by mouth daily.   sodium chloride flush 0.9 % Soln Commonly known as:  NS 5 mLs by Intracatheter route every 8 (eight) hours for 10 days.      Follow-up Information    Lequita Asal, MD. Go on 02/09/2019.   Specialty:  Hematology and Oncology Why:  @9 :45 for hospital follow up Contact information: [place] [address] [phone]        Benjamine Sprague, DO. Go on 02/11/2019.   Specialty:  Surgery Why:  @10 :15am Contact information: [place] [address] [phone]        [place]. Go on 02/10/2019.   Why:  @9 :30am for drain interrogation and possible removal. Do not eat any solids 4 hours before appointment. You can drink liquids. Please note the correct address is [address] [place] [address] in [place] information: [place] [address] [phone]            Total time spent arranging discharge was >18min. Signed: Benjamine Sprague 02/03/2019, 10:42 AM

## 2019-02-03 NOTE — Progress Notes (Signed)
Discharge instructions reviewed with patient and daughter including followup visits and new medications.  Also provided extensive drain care teaching including flushing, emptying and recording drainage, and dressing changes.  Understanding was verbalized and all questions were answered.  IV removed without complication; patient tolerated well.  Patient discharged home via wheelchair in stable condition escorted by volunteer staff.

## 2019-02-03 NOTE — Progress Notes (Signed)
Tumor Board Documentation  Tonya Crawford was presented by Dr Mike Gip at our Tumor Board on 02/03/2019, which included representatives from medical oncology, radiation oncology, surgical, radiology, pathology, navigation, internal medicine, research, palliative care, pulmonology.  Tonya Crawford currently presents as a new patient, for discussion, for Tonya Crawford with history of the following treatments: surgical intervention(s).  Additionally, we reviewed previous medical and familial history, history of present illness, and recent lab results along with all available histopathologic and imaging studies. The tumor board considered available treatment options and made the following recommendations:   PAthology requested from [facility] for further testing, Possible chemotherapy  The following procedures/referrals were also placed: No orders of the defined types were placed in this encounter.   Clinical Trial Status: not discussed   Staging used: AJCC Stage Group AJCC Staging:       Group: High Grade Urothelial Carcinoma per [facility] site-specific guidelines NCCN were discussed with respect to the case.  Tumor board is a meeting of clinicians from various specialty areas who evaluate and discuss patients for whom a multidisciplinary approach is being considered. Final determinations in the plan of care are those of the provider(s). The responsibility for follow up of recommendations given during tumor board is that of the provider.   Today's extended care, comprehensive team conference, Tonya Crawford was not present for the discussion and was not examined.   Multidisciplinary Tumor Board is a multidisciplinary case peer review process.  Decisions discussed in the Multidisciplinary Tumor Board reflect the opinions of the specialists present at the conference without having examined the patient.  Ultimately, treatment and diagnostic decisions rest with the primary provider(s) and the patient.

## 2019-02-03 NOTE — Care Management (Signed)
Patient discharged home today. Daughter at bedside for assessment.  At baseline patient is independent and lives at home alone in [place].  Due to her need for chemo she is staying with her daughter in [place], plan is for at least the next 5 months.  Patient has a new PCP appointment arranged with Dr. Sanda Klein.  Denies issues with transportation or with obtaining medications.  Daughter states that she feels comfortable managing the drains in the home.  Patient has a RW in the home.  Patient and daughter do not feel that any additional DME is indicated at this time.  Daughter states that may eventually purchase an elevated toilet seat.  No RNCM needs identified for discharge.

## 2019-02-05 LAB — AEROBIC/ANAEROBIC CULTURE W GRAM STAIN (SURGICAL/DEEP WOUND)

## 2019-02-05 LAB — AEROBIC/ANAEROBIC CULTURE (SURGICAL/DEEP WOUND)

## 2019-02-07 ENCOUNTER — Other Ambulatory Visit: Payer: Self-pay | Admitting: Surgery

## 2019-02-08 ENCOUNTER — Telehealth: Payer: Self-pay

## 2019-02-08 NOTE — Telephone Encounter (Signed)
Flagged on EMMI report for having unfilled prescriptions.  First attempt to reach patient made, however unable to reach patient.  Left voicemail encouraging callback. Will attempt at later time.    

## 2019-02-09 ENCOUNTER — Inpatient Hospital Stay: Payer: Medicare HMO | Admitting: Hematology and Oncology

## 2019-02-09 NOTE — Telephone Encounter (Signed)
Second attempt made, however unable to reach patient.  Left another voicemail encouraging callback for any questions or concerns.  No further attempts to reach at this time.

## 2019-02-10 ENCOUNTER — Other Ambulatory Visit: Payer: Medicare HMO

## 2019-02-11 ENCOUNTER — Inpatient Hospital Stay: Payer: Medicare HMO | Admitting: Hematology and Oncology

## 2019-02-11 ENCOUNTER — Inpatient Hospital Stay: Payer: Medicare HMO | Admitting: Oncology

## 2019-02-14 ENCOUNTER — Ambulatory Visit: Payer: Medicare HMO

## 2019-02-14 ENCOUNTER — Encounter: Payer: Self-pay | Admitting: Oncology

## 2019-02-14 ENCOUNTER — Other Ambulatory Visit: Payer: Self-pay | Admitting: *Deleted

## 2019-02-14 ENCOUNTER — Inpatient Hospital Stay: Payer: Medicare HMO

## 2019-02-14 ENCOUNTER — Inpatient Hospital Stay: Payer: Medicare HMO | Attending: Hematology and Oncology | Admitting: Oncology

## 2019-02-14 ENCOUNTER — Other Ambulatory Visit: Payer: Self-pay | Admitting: Surgery

## 2019-02-14 VITALS — BP 119/75 | HR 88 | Temp 97.1°F | Resp 18 | Wt 112.7 lb

## 2019-02-14 DIAGNOSIS — T8143XA Infection following a procedure, organ and space surgical site, initial encounter: Secondary | ICD-10-CM

## 2019-02-14 DIAGNOSIS — E860 Dehydration: Secondary | ICD-10-CM | POA: Insufficient documentation

## 2019-02-14 DIAGNOSIS — K630 Abscess of intestine: Secondary | ICD-10-CM

## 2019-02-14 DIAGNOSIS — E440 Moderate protein-calorie malnutrition: Secondary | ICD-10-CM

## 2019-02-14 DIAGNOSIS — C689 Malignant neoplasm of urinary organ, unspecified: Secondary | ICD-10-CM | POA: Insufficient documentation

## 2019-02-14 DIAGNOSIS — Z7189 Other specified counseling: Secondary | ICD-10-CM | POA: Insufficient documentation

## 2019-02-14 DIAGNOSIS — D649 Anemia, unspecified: Secondary | ICD-10-CM

## 2019-02-14 DIAGNOSIS — Z515 Encounter for palliative care: Secondary | ICD-10-CM | POA: Insufficient documentation

## 2019-02-14 DIAGNOSIS — T8149XA Infection following a procedure, other surgical site, initial encounter: Secondary | ICD-10-CM

## 2019-02-14 LAB — COMPREHENSIVE METABOLIC PANEL
ALT: 33 U/L (ref 0–44)
AST: 42 U/L — ABNORMAL HIGH (ref 15–41)
Albumin: 2.5 g/dL — ABNORMAL LOW (ref 3.5–5.0)
Alkaline Phosphatase: 495 U/L — ABNORMAL HIGH (ref 38–126)
Anion gap: 8 (ref 5–15)
BUN: 22 mg/dL (ref 8–23)
CO2: 25 mmol/L (ref 22–32)
Calcium: 8.2 mg/dL — ABNORMAL LOW (ref 8.9–10.3)
Chloride: 99 mmol/L (ref 98–111)
Creatinine, Ser: 1.02 mg/dL — ABNORMAL HIGH (ref 0.44–1.00)
GFR calc non Af Amer: 52 mL/min — ABNORMAL LOW (ref >60)
Glucose, Bld: 87 mg/dL (ref 70–99)
Potassium: 4.7 mmol/L (ref 3.5–5.1)
SODIUM: 132 mmol/L — AB (ref 135–145)
Total Bilirubin: 1.1 mg/dL (ref 0.3–1.2)
Total Protein: 7.2 g/dL (ref 6.5–8.1)

## 2019-02-14 LAB — CBC WITH DIFFERENTIAL/PLATELET
Abs Immature Granulocytes: 0.06 10*3/uL (ref 0.00–0.07)
Basophils Absolute: 0 10*3/uL (ref 0.0–0.1)
Basophils Relative: 0 %
EOS ABS: 0 10*3/uL (ref 0.0–0.5)
Eosinophils Relative: 0 %
HCT: 31.5 % — ABNORMAL LOW (ref 36.0–46.0)
Hemoglobin: 9.7 g/dL — ABNORMAL LOW (ref 12.0–15.0)
Immature Granulocytes: 1 %
Lymphocytes Relative: 12 %
Lymphs Abs: 1.2 10*3/uL (ref 0.7–4.0)
MCH: 26.9 pg (ref 26.0–34.0)
MCHC: 30.8 g/dL (ref 30.0–36.0)
MCV: 87.5 fL (ref 80.0–100.0)
Monocytes Absolute: 0.6 10*3/uL (ref 0.1–1.0)
Monocytes Relative: 6 %
NRBC: 0 % (ref 0.0–0.2)
Neutro Abs: 8.7 10*3/uL — ABNORMAL HIGH (ref 1.7–7.7)
Neutrophils Relative %: 81 %
Platelets: 246 10*3/uL (ref 150–400)
RBC: 3.6 MIL/uL — ABNORMAL LOW (ref 3.87–5.11)
RDW: 15 % (ref 11.5–15.5)
WBC: 10.7 10*3/uL — ABNORMAL HIGH (ref 4.0–10.5)

## 2019-02-14 LAB — LACTATE DEHYDROGENASE: LDH: 228 U/L — AB (ref 98–192)

## 2019-02-14 LAB — RETIC PANEL
Immature Retic Fract: 12.5 % (ref 2.3–15.9)
RBC.: 3.6 MIL/uL — ABNORMAL LOW (ref 3.87–5.11)
Retic Count, Absolute: 58.3 10*3/uL (ref 19.0–186.0)
Retic Ct Pct: 1.6 % (ref 0.4–3.1)
Reticulocyte Hemoglobin: 25.1 pg — ABNORMAL LOW (ref >27.9)

## 2019-02-14 MED ORDER — MEGESTROL ACETATE 40 MG PO TABS: 40.0000 mg | ORAL_TABLET | Freq: Two times a day (BID) | ORAL | 0 refills | Status: AC

## 2019-02-14 MED ORDER — SODIUM CHLORIDE 0.9 % IV SOLN
Freq: Once | INTRAVENOUS | Status: AC
  Administered 2019-02-14: 13:00:00 via INTRAVENOUS
  Filled 2019-02-14: qty 250

## 2019-02-14 NOTE — Research (Signed)
I met with patient and daughter Tonya Crawford this morning after Dr. Tasia Catchings introduced research study "Blood sample collection to evaluate biomarkers in subjects with untreated solid tumors" sponsored by [employer].  I informed her about purpose of study, that her participation in the study was voluntary and she could withdraw at any time and risks and benefits.  Her daughter ask if Dr. Tasia Catchings felt the patient was strong enough to give the 5 vials of blood the study requires along with labs Dr. Tasia Catchings ordered.  I spoke with Dr. Tasia Catchings and she reviewed labs and gave approval for patient to be enrolled.  Patient signed consent/HIPPA (IRB approved 10/12/2018).  Copy of signed form given to patient for her records along with my business card in case she had questions.   Lab personnel came to exam room to draw labs ordered by Dr. Tasia Catchings and research labs.  I gave patient $70 gift certificate provided by study and she signed a statement to that effect. I thanked her for participating in the study and assured her that she could contact the department if any questions/concerns should arise.  Tonya Crawford Oncology Research Assistant 02/14/2019 4:18 PM

## 2019-02-14 NOTE — Progress Notes (Signed)
Nutrition Assessment   Reason for Assessment:   Verbal referral from Dr Tasia Catchings regarding poor appetite and weight loss   ASSESSMENT:   83 year old female with high grade urothelial carcinoma recently diagnosis at [facility].  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  Patient was admitted to [facility] on 01/29/2019 with abdominal pain and fatigue. Noted on 2/16 percutaneous catheter was placed for drainage of abscesses  Met with patient and daughter in clinic this am.  Patient not feeling well will likely be getting fluids today following lab results.  Reports no appetite since surgery in Jan.  (about 1 month). [name] with patient today and reports patient is eating like a bird.  Mostly 2 meals daily but just eating bites at most (few bites chicken, broccoli).  Has been drinking ensure maybe 1/4-1/2 daily.  Taking few sips of water. Reports initially have gas, bloating, abdominal pain but these symptoms are better.  Reports diarrhea type stool about every 3-4 days.     Nutrition Focused Physical Exam: deferred today, patient wrapped in blankets, not feeling well   Medications: MVI   Labs: Na 132, creatine 1.02  Anthropometrics:   Height: 66.5 inches Weight: 112 lb 11.2 oz today UBW: Noted per inpatient RD note 127 lb never over 134 ll, unsure time frame  Noted 121 lb on 01/30/2019 BMI: 17  7% weight loss in 2 weeks, significant  Estimated Energy Needs  Kcals: 1500-1700  Protein: 75-85 g Fluid: 1.7 L/d   NUTRITION DIAGNOSIS: Inadequate oral intake related to abdominal pain, bloating, surgery, cancer as evidenced by 7% weight loss in 2 weeks per chart, poor po intake   INTERVENTION:  Discussed strategies to help increase calories and protein with patient and daughter.  Fact sheet provided Encouraged high calorie oral nutrition supplement at least 1 daily.  Samples provided today with coupons. Encouraged small frequent snacks during the  day Contact information given to patient   MONITORING, EVALUATION, GOAL: weight trends, intake   Next Visit: March 12 for follow-up, daughter to call if needs to reschedule  [name], [name], [place] Registered Dietitian [phone] (pager)

## 2019-02-14 NOTE — Progress Notes (Signed)
Pt here for initial visit. CT has been postponed until tomorrow at IR in [employer]. She has follow up with surgeon tomorrow and will possibly have drains removed. Patient has poor appetite and has lost about 10 pounds.

## 2019-02-14 NOTE — Progress Notes (Signed)
Hematology/Oncology Follow Up Note [facility]  Telephone:([phone] [phone]  Patient Care Team: Arnetha Courser, MD as PCP - General (Family Medicine)   Name of the patient: Tonya Crawford  [id number]  1936/02/24   REASON FOR VISIT  PERTINENT ONCOLOGY HISTORY Tonya Crawford is a 83 y.o.afemale who has above oncology history reviewed by me today presented for follow up visit for management of urothelia cancer.  Patient was recently admitted from 01/29/2019-02/03/2019.  She was seen by my colleague Dr. Mike Gip.  Patient prefers to stay at [facility] for her care.  Establish care with me on 02/14/2019. I reviewed patient's past medical history, images and pathology results. #Patient had abdominal pelvis CT at [facility] on 07/13/2018 reviewed moderate dilation of left collecting system and proximal ureter with hyperdensity narrowing of mid ureter.  Stone versus soft tissue mass.  She was followed up by Dr. Lewie Loron urology.  Stent was placed in August 2019 and removed 4 weeks later.  Lasix renal scan on 09/14/2018 reviewed and minimally functional left kidney.  Patient had CT chest abdomen pelvis on 01/05/2019 which revealed a large enlarging solid mass involving the left renal pelvis and the left ureter consistent with urothelial malignancy.  There was obstruction of the left kidney and severe hydronephrosis.  There was contact with the left psoas muscle and questionable muscle involvement.  2 left perirenal lymph nodes are suspicious for metastasis.  There was concern for portal vein thrombosis involving the anterior branch of the right portal vein.  7 mm lesion of the right hepatic lobe and a 1 cm pancreatic tail cyst. Patient was admitted to [facility] from 01/13/2019- 01/15/2019.  She was scheduled to have a laparoscopic left nephrectomy by Dr. Darcella Gasman on 01/13/2019.  The procedure was aborted as there was hard tissue around the entire path of the  ureter.  Attempt to get through this between aorta and the kidney were not feasible due to induration.  The renal vein could not be safely seen due to encapsulation and reactive tissue throughout.  Biopsy of the lymph node revealed invasive high-grade urothelial carcinoma within the fibroadipose tissue. Labs at [facility] on 01/14/2019 included a CA27.29 of 17.7 (0-38.6), CA19-9 of 12 (0-35), and LDH of 215 (140-271).  CBC on 01/15/2019 revealed a hematocrit of 37, hemoglobin 12.2, MCV 87, platelets 181,000, and WBC 4200.  Creatinine was 1.32  Patient declined after [facility].  She was seen in the [facility] on 01/29/2019 with increasing abdominal pain and fatigue. A repeat abdomen/pelvis CT on 01/29/2019 showed large urothelial neoplasm involving left renal pelvis and the left ureter.  Likely along its entire length.  Causing market left upper urinary tract obstruction.  Adjacent left periaortic retroperitoneal metastatic lymphadenopathy. Large abscess in the left mid abdomen, adjacent to the mid descending colon, with extravasated likely vascular contrast within the collection.  It would appear that the contrast arise from inferior mesenteric vessel likely branch of the inferior mesenteric Tarik vein. Small abscess in pelvis.  Descending and sigmoid colitis and proctitis.  Sigmoid diverticulosis. Filling defect in the urinary bladder on delayed imaging indicating a blood clot.  This is causing partial obstruction of the right urinary tract. Nonobstructing 4 mm calculus or adjacent calculi in the lower pole calyx of right kidney. Geographic area of hepatic steatosis.  Indeterminate solitary approximate 1.2 cm lesion involving the left lobe of liver. 1 cm simple cyst arising from proximal tail of  the pancreas. Cholelithiasis without sonographic evidence of acute cholecystitis.  #Patient underwent CT-guided drainage by percutaneous catheter placement on 01/30/2019.  Her culture reviewed abundant  gram-positive cocci in pairs and clusters.  Abundant gram-negative rods, moderate gram-positive rods. Multiple organisms present, none predominant.  No anaerobic isolated. Patient was discharged on 02/03/2019 with a course of Augmentin 875/125 mg twice daily for 5 days.  She has finished a course.  # She has a history of stage IIIA right breast cancer s/p lumpectomy on 12/12/2003 at [facility].  Pathology revealed a 2.5 x 1.8 x 1.6 cm grade III invasive ductal carcinoma with focal component of of non-comedo intraductal carcinoma.  Margins were negative.  There were 11 of 20 lymph nodes positive.  The largest lymph node was 2.2 cm.  Tumor was ER+, PR+, and Her2/neu 2+.  Pathologic stage was T2N3Mx.  She received chemotherapy (unknown type), radiation, and 5 years of tamoxifen.   CA27.29 was 17.7 on 01/14/2019.  INTERVAL HISTORY Patient was accompanied by daughter to today's visit to establish care with me. She feels very fatigued and tired today.  She lies on examination table while waiting for me. Patient reports some history, limited due to weakness.  I obtained majority of her history from daughter. Patient lives in [place].  Recently moved to live with daughter at [place]. She reports still feeling tired and weak since recent discharge.  Finished antibiotic course.  Denies any fever, chills, abdominal pain, nausea or vomiting.  She eats very little very poor appetite.  She does not drink much fluid Denies any dysuria, increased frequency or urgency.  She spent most of her time lying in her recliner during the day.  She is able to walk very short distance to go to bathroom.   She weighs 112 pounds today while she weight 121 pounds during her recent admission.  Daughter also reports that patient has been having 1 loose bowel movements every 2 to 3 days.  Review of Systems  Constitutional: Positive for appetite change, fatigue and unexpected weight change. Negative for chills and fever.  HENT:    Negative for hearing loss and voice change.   Eyes: Negative for eye problems.  Respiratory: Negative for chest tightness and cough.   Cardiovascular: Negative for chest pain.  Gastrointestinal: Negative for abdominal distention, abdominal pain and blood in stool.  Endocrine: Negative for hot flashes.  Genitourinary: Negative for difficulty urinating and frequency.   Musculoskeletal: Negative for arthralgias.  Skin: Negative for itching and rash.  Neurological: Positive for extremity weakness.  Hematological: Negative for adenopathy.  Psychiatric/Behavioral: Negative for confusion.      No Known Allergies   Past Medical History:  Diagnosis Date  . Cancer [facility])    Breast Cancer  . Cancer ([place])    Urothilial Cancer  . Hypertension   . Thyroid disease      Past Surgical History:  Procedure Laterality Date  . BREAST LUMPECTOMY  12/12/2003  . percutaneous drainage of abdomen abcess  01/30/2019    Social History   Socioeconomic History  . Marital status: Divorced    Spouse name: Not on file  . Number of children: Not on file  . Years of education: Not on file  . Highest education level: Not on file  Occupational History  . Not on file  Social Needs  . Financial resource strain: Not on file  . Food insecurity:    Worry: Not on file    Inability: Not on file  . Transportation  needs:    Medical: Not on file    Non-medical: Not on file  Tobacco Use  . Smoking status: Former Smoker    Types: Cigarettes  . Smokeless tobacco: Never Used  Substance and Sexual Activity  . Alcohol use: Yes    Comment: occasionally  . Drug use: Not on file  . Sexual activity: Not on file  Lifestyle  . Physical activity:    Days per week: Not on file    Minutes per session: Not on file  . Stress: Not on file  Relationships  . Social connections:    Talks on phone: Not on file    Gets together: Not on file    Attends religious service: Not on file    Active member of club or  organization: Not on file    Attends meetings of clubs or organizations: Not on file    Relationship status: Not on file  . Intimate partner violence:    Fear of current or ex partner: Not on file    Emotionally abused: Not on file    Physically abused: Not on file    Forced sexual activity: Not on file  Other Topics Concern  . Not on file  Social History Narrative  . Not on file  reports that she has never smoked. She has never used smokeless tobacco. She reports current alcohol use. No history on file for drug. She smoked up to 2 packs/day from age [age].  She occasionally drink alcohol.  She denies any exposure to environmental radiation or toxins.  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  She lives in [place].  She was independent with her ADLs prior to admission.  Her daughter lives in [place].  She plans to live with her daughter.   Family History  Problem Relation Age of Onset  . Lung cancer Sister   . Adrenal disorder Sister    Report one of her sisters had lung cancer, and another sister had adrenal tumor.    Current Outpatient Medications:  .  feeding supplement, ENSURE ENLIVE, (ENSURE ENLIVE) LIQD, Take 237 mLs by mouth 2 (two) times daily between meals., Disp: 237 mL, Rfl: 12 .  ibuprofen (ADVIL,MOTRIN) 200 MG tablet, Take 200 mg by mouth every 6 (six) hours as needed., Disp: , Rfl:  .  levothyroxine (SYNTHROID, LEVOTHROID) 88 MCG tablet, Take 88 mcg by mouth daily., Disp: , Rfl:  .  lisinopril (PRINIVIL,ZESTRIL) 5 MG tablet, Take 5 mg by mouth daily., Disp: , Rfl:  .  multivitamin-lutein (OCUVITE-LUTEIN) CAPS capsule, Take 1 capsule by mouth daily for 30 days., Disp: 30 capsule, Rfl: 0 .  polyethylene glycol (MIRALAX) packet, Take 17 g by mouth daily., Disp: 14 each, Rfl: 0 .  alum & mag hydroxide-simeth (MAALOX/MYLANTA) 200-200-20 MG/5ML suspension, Take 30 mLs by mouth every 4 (four) hours as needed for indigestion or  heartburn. (Patient not taking: Reported on 02/14/2019), Disp: 355 mL, Rfl: 0 .  megestrol (MEGACE) 40 MG tablet, Take 1 tablet (40 mg total) by mouth 2 (two) times daily., Disp: 60 tablet, Rfl: 0 .  oxyCODONE-acetaminophen (PERCOCET/ROXICET) 5-325 MG tablet, Take 1 tablet by mouth every 6 (six) hours as needed for pain., Disp: , Rfl:   Physical exam: ECOG 3 Vitals:   02/14/19 1040  BP: 119/75  Pulse: 88  Resp: 18  Temp: (!) 97.1 F (36.2 C)  TempSrc: Tympanic  Weight: 112 lb  11.2 oz (51.1 kg)   Physical Exam Constitutional:      General: She is not in acute distress.    Appearance: She is ill-appearing.  HENT:     Head: Normocephalic and atraumatic.  Eyes:     General: No scleral icterus.    Pupils: Pupils are equal, round, and reactive to light.  Neck:     Musculoskeletal: Normal range of motion and neck supple.  Cardiovascular:     Rate and Rhythm: Normal rate and regular rhythm.     Heart sounds: Normal heart sounds.  Pulmonary:     Effort: Pulmonary effort is normal. No respiratory distress.     Breath sounds: No wheezing.  Abdominal:     General: Bowel sounds are normal. There is no distension.     Palpations: Abdomen is soft. There is no mass.     Tenderness: There is no abdominal tenderness.  Musculoskeletal: Normal range of motion.        General: No deformity.  Skin:    General: Skin is warm and dry.     Findings: No erythema or rash.  Neurological:     Mental Status: She is alert and oriented to person, place, and time.     Cranial Nerves: No cranial nerve deficit.     Coordination: Coordination normal.  Psychiatric:        Behavior: Behavior normal.        Thought Content: Thought content normal.     CMP Latest Ref Rng & Units 02/14/2019  Glucose 70 - 99 mg/dL 87  BUN 8 - 23 mg/dL 22  Creatinine 0.44 - 1.00 mg/dL 1.02(H)  Sodium 135 - 145 mmol/L 132(L)  Potassium 3.5 - 5.1 mmol/L 4.7  Chloride 98 - 111 mmol/L 99  CO2 22 - 32 mmol/L 25  Calcium 8.9 -  10.3 mg/dL 8.2(L)  Total Protein 6.5 - 8.1 g/dL 7.2  Total Bilirubin 0.3 - 1.2 mg/dL 1.1  Alkaline Phos 38 - 126 U/L 495(H)  AST 15 - 41 U/L 42(H)  ALT 0 - 44 U/L 33   CBC Latest Ref Rng & Units 02/14/2019  WBC 4.0 - 10.5 K/uL 10.7(H)  Hemoglobin 12.0 - 15.0 g/dL 9.7(L)  Hematocrit 36.0 - 46.0 % 31.5(L)  Platelets 150 - 400 K/uL 246   RADIOGRAPHIC STUDIES: I have personally reviewed the radiological images as listed and agreed with the findings in the report. Ct Abdomen Pelvis Wo Contrast  Result Date: 01/30/2019 CLINICAL DATA:  83 year old with a recently diagnosed urothelial neoplasm at [facility] in [place]. By report, she underwent laparoscopic surgery for nephrectomy which was aborted due to the findings. She presents now with worsening generalized abdominal pain. She has leukocytosis, elevated lipase, elevated liver function tests, hypoalbuminemia and hypocalcemia on laboratory evaluation. CT scan of the abdomen and pelvis performed 01/29/2019 demonstrates an indeterminate fluid collection along the left pericolic gutter with concern for either vascular or enteric contrast extravasation. Please perform noncontrast abdominal CT for further evaluation of this indeterminate fluid collection. EXAM: CT ABDOMEN AND PELVIS WITHOUT CONTRAST TECHNIQUE: Multidetector CT imaging of the abdomen and pelvis was performed following the standard protocol without IV contrast. COMPARISON:  CT abdomen pelvis-01/29/2019 FINDINGS: Lower chest: Limited visualization the lower thorax demonstrates trace bilateral effusions with associated bibasilar heterogeneous opacities, left greater than right. No discrete focal airspace opacities. Normal heart size. Trace pericardial effusion. There is diffuse decreased attenuation of the intra cardiac blood pool suggestive anemia. Coronary artery calcifications. Hepatobiliary: Known ill-defined  hepatic lesions are not well evaluated on present noncontrast examination. Trace  amount of perihepatic ascites unchanged. Suspected cholelithiasis. Otherwise, normal noncontrast appearance the gallbladder. Pancreas: Unchanged appearance of the approximately 1.0 cm hypoattenuating lesion within the mid body of the pancreas (image 27, series 2, incompletely characterized on present examination though potentially representative of a pancreatic cyst/IPMN is not found to demonstrate enhancement contrast-enhanced examination performed day prior. Spleen: Normal noncontrast appearance of the spleen. Adrenals/Urinary Tract: Redemonstrated infiltrative mass involving the left renal pelvis and proximal ureter with associated marked left-sided pelvicaliectasis. Excreted contrast from examination performed day prior remains within the right renal collecting system. No evidence of right-sided urinary obstruction. Minimal amount bilateral perinephric stranding. There is unchanged mild thickening the left mid gland without discrete nodule. Normal noncontrast appearance of the right adrenal gland. Enteric contrast is seen within the urinary bladder. Grossly unchanged appearance of previously identified loculated mixed air and fluid containing collection within the left pericolic gutter with dominant more anteriorly located collection measuring approximately 8.- x 5.9 x 8.9 cm (axial image 45, series 2; 36, series 5) and caudal component measuring approximately 5.5 x 2.6 x 6.8 cm (axial image 51 series 2; 47, series 5), unchanged, previously, 7.8 x 7.7 x 8.6 cm and 5.8 x 2.8 x 6.6 cm respectively. Previously noted high-density material within the collection has dissipated in the interval. Stomach/Bowel: Ingested enteric contrast is now seen extending to the level of the rectum. Grossly unchanged scattered foci of subcutaneous emphysema. No pneumatosis or portal venous gas. Vascular/Lymphatic: Atherosclerotic plaque within a tortuous but normal caliber abdominal aorta. Reproductive: Normal noncontrast appearance  of the pelvic organs. Re-demonstrated approximately 5.3 x 2.2 cm fluid collection with the pelvic cul-de-sac (image 73, series 2). Other: Redemonstrated subcutaneous emphysema about the body wall, presumably the residua of recent attempted laparoscopic intervention. Musculoskeletal: No acute or aggressive osseous abnormalities. Moderate DDD of L5-S1 and to a lesser extent, L4-L5 with disc space height loss, endplate irregularity small posteriorly directed disc osteophyte complexes at these locations. IMPRESSION: 1. Unchanged loculated mixed air and fluid containing collection within the left pericolic gutter with dissipated of previously noted high density material. Given lack of significant increase in size of the collection as well as proximity of the medial aspect of the collection to the ascending portion of the duodenum on preceding examination, the high-density material within the collection is favored to represented enteric contrast. 2. Unchanged appearance of approximately 5.3 x 2.3 cm collection within the pelvic cul-de-sac 3. Unchanged appearance of known infiltrative left-sided renal mass with associated marked upstream pelvicaliectasis. 4. Grossly unchanged suspected hepatic metastasis 5. Additional ancillary findings as above. 6. Aortic Atherosclerosis (ICD10-I70.0). PLAN: - The patient is to undergo CT-guided aspiration/drainage of loculated left-sided pericolic collections for diagnostic and therapeutic purposes. - Appropriateness of proceeding with left-sided percutaneous nephrostomy placement was discussed with on-call urologist, Dr. Junious Silk, and the decision was made NOT to proceed with image guided nephrostomy catheter placement at this time given concern for track seeding of the malignancy. Electronically Signed   By: Sandi Mariscal M.D.   On: 01/30/2019 10:38   Ct Abdomen Pelvis W Contrast  Result Date: 01/29/2019 CLINICAL DATA:  83 year old with a recently diagnosed urothelial neoplasm at  [facility] in [place]. By report, she underwent laparoscopic surgery for nephrectomy which was aborted due to the findings. She presents now with worsening generalized abdominal pain. She has leukocytosis, elevated lipase, elevated liver function tests, hypoalbuminemia and hypocalcemia on laboratory evaluation. EXAM: CT ABDOMEN AND  PELVIS WITH CONTRAST TECHNIQUE: Multidetector CT imaging of the abdomen and pelvis was performed using the standard protocol following bolus administration of intravenous contrast. Delayed imaging through the abdomen and pelvis was also performed. CONTRAST:  62m OMNIPAQUE IOHEXOL 300 MG/ML IV. Oral contrast was also administered. COMPARISON:  No outside imaging is currently available for comparison. FINDINGS: Lower chest: Small BILATERAL pleural effusions, LEFT greater than RIGHT, with associated passive atelectasis in the lower lobes. Visualized lung bases otherwise clear. Heart size upper normal. No pericardial effusion. Hepatobiliary: Geographic low attenuation scattered throughout the liver. Approximate 1.3 cm mass involving the LEFT lobe (series 2, image 20). No other discrete hepatic masses. Small gallstones in the gallbladder without CT evidence of acute cholecystitis. No biliary ductal dilation. Pancreas: Approximate 1.1 x 1.0 cm well-circumscribed simple cyst arising from the proximal tail of the pancreas. No other pancreatic masses. No peripancreatic edema/inflammation. Spleen: Normal in size and appearance. Small focus of accessory splenic tissue ANTERIOR to the spleen at the hilum. Adrenals/Urinary Tract: Normal appearing adrenal glands. Large mass with its epicenter in the LEFT renal pelvis extending along the length of the proximal and mid ureter in the LEFT retroperitoneum over a length of at least 15 cm, and perhaps involving the entire ureter. The mass causes marked hydronephrosis involving the LEFT kidney with significant obstruction, as there is delayed contrast  excretion by the LEFT kidney and no contrast in the collecting system on the delayed images. Mild RIGHT hydroureteronephrosis, though there is no obstructing stone or mass in the RIGHT ureter, and the ureter can be followed to the bladder on the delayed images. Adjacent non-obstructing RIGHT LOWER pole renal calculi, the largest measuring approximately 4 mm. RIGHT kidney otherwise normal in appearance. Filling defect dependently in the urinary bladder on the delayed images is likely blood clot; this clot is adjacent to the RIGHT ureteral orifice and may account for the mild hydroureteronephrosis. Stomach/Bowel: Stomach normal in appearance for the degree of distention. Normal-appearing small bowel, with upper normal caliber distal jejunal loops. Marked thickening of the wall of the descending colon, sigmoid colon and rectum. Sigmoid colon diverticulosis. Mobile cecum positioned in the RIGHT UPPER QUADRANT of the abdomen. Lipoma involving the ileocecal valve. Appendix not reliably identified, but no evidence of pericecal inflammation. Vascular/Lymphatic: Moderate aortoiliofemoral atherosclerosis without evidence of aneurysm. LEFT periaortic retroperitoneal lymphadenopathy adjacent to the urothelial neoplasm, the largest node measuring approximately 1.7 x 1.0 cm. Scattered normal sized lymph nodes elsewhere in the retroperitoneum. No pathologic lymphadenopathy elsewhere. Reproductive: Normal appearing uterus.  No adnexal masses. Other: Bilobed fluid collection with an enhancing wall containing gas in the LEFT mid abdomen, adjacent to the mid descending colon, measuring approximately 9 x 7.5 x 9.5 cm. There is high attenuation contrast material within this collection and since there is no oral contrast in the adjacent bowel, I believe this is extravasated vascular contrast from a branch of what I believe is the INFERIOR mesenteric vein. Fluid collection with enhancing wall in the dependent portion of the pelvis  measuring approximately 5 x 2 x 2 cm. Musculoskeletal: No acute findings. No evidence of osseous metastatic disease. Degenerative disc disease at L4-5 and L5-S1. Bone islands in the first sacral segment. IMPRESSION: 1. Large urothelial neoplasm involving the LEFT renal pelvis and the LEFT ureter, likely along its entire length, causing marked LEFT UPPER urinary tract obstruction. Adjacent LEFT periaortic retroperitoneal metastatic lymphadenopathy. 2. Large abscess in the LEFT mid abdomen, adjacent to the mid descending colon, with extravasated likely vascular contrast  within the collection. It would appear that the contrast arises from an INFERIOR mesenteric vessel, likely a branch of the INFERIOR mesenteric vein. 3. Smaller abscess in the dependent portion of the pelvis. 4. Descending and sigmoid colitis and proctitis. Sigmoid colon diverticulosis. 5. Filling defect in the urinary bladder on delayed imaging indicating a blood clot. This is causing partial obstruction of the RIGHT urinary tract, as there is mild hydroureteronephrosis but no obstructing stone or mass. 6. Non-obstructing approximate 4 mm calculus or adjacent calculi in a LOWER pole calyx of the RIGHT kidney. 7. Geographic areas of hepatic steatosis. Indeterminate solitary approximate 1.2 cm lesion involving the LEFT lobe of liver. 8. Approximate 1 cm simple cyst arising from the proximal tail of the pancreas. This is most likely benign, given its appearance. This can be evaluated on follow-up CT examinations during her course of therapy. 9. Cholelithiasis without sonographic evidence of acute cholecystitis. 10. Small BILATERAL pleural effusions, LEFT greater than RIGHT, and associated mild passive atelectasis involving the lower lobes. Aortic Atherosclerosis (ICD10-170.0) I would telephoned these results directly to Dr. Burlene Arnt of the emergency department at the time of interpretation on 01/29/2019 at 3:30 p.m. Electronically Signed   By: Evangeline Dakin M.D.   On: 01/29/2019 15:36   Ct Image Guided Drainage By Percutaneous Catheter  Result Date: 01/30/2019 INDICATION: 83 year old with a recently diagnosed urothelial neoplasm at [facility] in [place]. By report, she underwent laparoscopic surgery for nephrectomy which was aborted due to the findings. She presented to the [facility] emergency department on 01/29/2019 with CT scan of the abdomen and pelvis demonstrating an indeterminate fluid collection along the left pericolic gutter, worrisome for postoperative abscess confirmed with subsequent noncontrast CT scan performed 01/30/2019. Patient presents now for CT-guided percutaneous drainage catheter placement for infection source control purposes. EXAM: CT IMAGE GUIDED DRAINAGE BY PERCUTANEOUS CATHETER x 2 COMPARISON:  CT abdomen pelvis-01/29/2019; 01/30/2019 MEDICATIONS: The patient is currently admitted to the hospital and receiving intravenous antibiotics. The antibiotics were administered within an appropriate time frame prior to the initiation of the procedure. ANESTHESIA/SEDATION: Moderate (conscious) sedation was employed during this procedure. A total of Versed 1 mg and Fentanyl 50 mcg was administered intravenously. Moderate Sedation Time: 26 minutes. The patient's level of consciousness and vital signs were monitored continuously by radiology nursing throughout the procedure under my direct supervision. CONTRAST:  None COMPLICATIONS: None immediate. PROCEDURE: Informed written consent was obtained from the patient after a discussion of the risks, benefits and alternatives to treatment. The patient was placed supine on the CT gantry and a pre procedural CT was performed re-demonstrating the known abscess/fluid collection within the left mid hemiabdomen measuring approximately 7.9 x 5.6 cm (image 58, series 2) and additional component within the motor lower abdomen/pelvis measuring approximately 6.0 x 2.7 cm (image 65, series 2). The  procedure was planned. A timeout was performed prior to the initiation of the procedure. The skin overlying the left mid and lower abdomen was prepped and draped in the usual sterile fashion. The overlying soft tissues were anesthetized with 1% lidocaine with epinephrine. 18 gauge trocar needles were utilized to coil short Amplatz wires within both collections. Propria position was confirmed with CT imaging. Initially, the track was dilated ultimately allowing placement of a 10 [place] all-purpose drainage catheter into the dominant abscess within the left mid hemiabdomen favored to be the collection most proximal to the expected location of enteric leak adjacent to the ascending portion of the duodenum. Following drainage catheter placement  approximately 100 cc of purulent material was aspirated Repeat CT scan was performed demonstrating a persistent collection with the left lower abdomen/pelvis and as such decision was made to place an additional drainage catheter at this location. Again, the track was dilated allowing placement of a additional 10 French percutaneous drainage catheter. Next, approximately 30 cc of purulent material was aspirated. Final catheter positioning was confirmed with CT imaging and both catheters were flushed with a small amount of saline. The dominant collection adjacent to the expected location of the enteric leak was connected to a gravity bag, while the additional catheter located within the lower abdomen/pelvis was connected to a JP bulb. Dressings were applied. The patient tolerated the procedure well without immediate postprocedural complication. IMPRESSION: 1. Successful CT guided placement of a 10 [place] all purpose drain catheter into the dominant collection with the left mid hemiabdomen with aspiration of 100 cc of purulent material. This drainage catheter is felt to be located adjacent to the expected location of enteric leak associated with the ascending portion of the duodenum  and as such was connected to a gravity bag. 2. Successful CT-guided placement of a 10 French all-purpose drainage catheter into remaining abscess within the left lower abdomen/pelvis with aspiration of 100 cc of purulent material. This drainage catheter was connected to a JP bulb. PLAN: - Recommend flushing both percutaneous drainage catheters with 10 cc saline per day. - Maintain diligent records regarding output from both drainage catheters - Once the output from one or both drainage catheters has decreased the less than 10 cc per day (excluding flush), repeat CT imaging may be performed as indicated. - Recommend drainage catheter injection prior to drainage catheter removal. Electronically Signed   By: Sandi Mariscal M.D.   On: 01/30/2019 17:51   Ct Image Guided Drainage By Percutaneous Catheter  Result Date: 01/30/2019 INDICATION: 83 year old with a recently diagnosed urothelial neoplasm at [facility] in [place]. By report, she underwent laparoscopic surgery for nephrectomy which was aborted due to the findings. She presented to the [facility] emergency department on 01/29/2019 with CT scan of the abdomen and pelvis demonstrating an indeterminate fluid collection along the left pericolic gutter, worrisome for postoperative abscess confirmed with subsequent noncontrast CT scan performed 01/30/2019. Patient presents now for CT-guided percutaneous drainage catheter placement for infection source control purposes. EXAM: CT IMAGE GUIDED DRAINAGE BY PERCUTANEOUS CATHETER x 2 COMPARISON:  CT abdomen pelvis-01/29/2019; 01/30/2019 MEDICATIONS: The patient is currently admitted to the hospital and receiving intravenous antibiotics. The antibiotics were administered within an appropriate time frame prior to the initiation of the procedure. ANESTHESIA/SEDATION: Moderate (conscious) sedation was employed during this procedure. A total of Versed 1 mg and Fentanyl 50 mcg was administered intravenously. Moderate Sedation  Time: 26 minutes. The patient's level of consciousness and vital signs were monitored continuously by radiology nursing throughout the procedure under my direct supervision. CONTRAST:  None COMPLICATIONS: None immediate. PROCEDURE: Informed written consent was obtained from the patient after a discussion of the risks, benefits and alternatives to treatment. The patient was placed supine on the CT gantry and a pre procedural CT was performed re-demonstrating the known abscess/fluid collection within the left mid hemiabdomen measuring approximately 7.9 x 5.6 cm (image 58, series 2) and additional component within the motor lower abdomen/pelvis measuring approximately 6.0 x 2.7 cm (image 65, series 2). The procedure was planned. A timeout was performed prior to the initiation of the procedure. The skin overlying the left mid and lower abdomen was prepped and  draped in the usual sterile fashion. The overlying soft tissues were anesthetized with 1% lidocaine with epinephrine. 18 gauge trocar needles were utilized to coil short Amplatz wires within both collections. Propria position was confirmed with CT imaging. Initially, the track was dilated ultimately allowing placement of a 10 [place] all-purpose drainage catheter into the dominant abscess within the left mid hemiabdomen favored to be the collection most proximal to the expected location of enteric leak adjacent to the ascending portion of the duodenum. Following drainage catheter placement approximately 100 cc of purulent material was aspirated Repeat CT scan was performed demonstrating a persistent collection with the left lower abdomen/pelvis and as such decision was made to place an additional drainage catheter at this location. Again, the track was dilated allowing placement of a additional 10 French percutaneous drainage catheter. Next, approximately 30 cc of purulent material was aspirated. Final catheter positioning was confirmed with CT imaging and both  catheters were flushed with a small amount of saline. The dominant collection adjacent to the expected location of the enteric leak was connected to a gravity bag, while the additional catheter located within the lower abdomen/pelvis was connected to a JP bulb. Dressings were applied. The patient tolerated the procedure well without immediate postprocedural complication. IMPRESSION: 1. Successful CT guided placement of a 10 [place] all purpose drain catheter into the dominant collection with the left mid hemiabdomen with aspiration of 100 cc of purulent material. This drainage catheter is felt to be located adjacent to the expected location of enteric leak associated with the ascending portion of the duodenum and as such was connected to a gravity bag. 2. Successful CT-guided placement of a 10 French all-purpose drainage catheter into remaining abscess within the left lower abdomen/pelvis with aspiration of 100 cc of purulent material. This drainage catheter was connected to a JP bulb. PLAN: - Recommend flushing both percutaneous drainage catheters with 10 cc saline per day. - Maintain diligent records regarding output from both drainage catheters - Once the output from one or both drainage catheters has decreased the less than 10 cc per day (excluding flush), repeat CT imaging may be performed as indicated. - Recommend drainage catheter injection prior to drainage catheter removal. Electronically Signed   By: Sandi Mariscal M.D.   On: 01/30/2019 17:51     Assessment and plan Patient is a 83 y.o. female presents to establish care for management of newly diagnosed high-grade urothelial carcinoma. 1. Urothelial carcinoma ([place])   2. Dehydration   3. Anemia, unspecified type   4. Postoperative intra-abdominal abscess   5. Malnutrition of moderate degree   6. Goals of care, counseling/discussion    Clinically [id number] disease.  Patient has had a PET scan done on 01/27/2019.  Will obtain PET scan results. The  diagnosis and care plan were discussed with patient and daughter in detail.  NCCN guidelines were reviewed and shared with patient and daughter..   Patient has very poor performance status and nephrectomy surgery was previously attempted at [facility] and was not successful.  Current goal of treatment which is to palliate disease, disease related symptoms, improve quality of life and hopefully prolong life was highlighted in our discussion.  Given her poor performance status, not eligible for aggressive chemotherapy treatments.  Upfront immunotherapy with Tecentriq regardless of PD-L levels can be an option.  Meanwhile I will reach out to [facility] to obtain PET scan results. Discussed with Dr.Corcoran, she has called pateint's previous oncolgosit's office and left message. Will follow up.  Recommend obtaining next generation sequence and PDL 1 testing on the biopsy.  Will obtain slides and blocks for pathology review at [facility]. For now, will wait until her intra-abdominal abscess resolved and she gets stronger.  Supportive care measures are necessary for patient well-being and will be provided as necessary.  # Also recommend genetic testing given her personal history of breast cancer and upper GU tract urothelia cancer. Patient and daughter are interested. Will refer to genetic counseling.  #Weight loss/poor oral intake/dehydration/malnutrition We will proceed with 1 L of normal saline today in the clinic.  We will repeat another IV fluid session later this week. Check CBC, CMP. Refer to dietitian who will see patient today. Refer home health for evaluation. Recommend starting Megace 40 mg twice daily for appetite stimulants.  #Goals of care was discussed.  Palliative intent.  Referred to establish care with palliative care service. #Anemia, hemoglobin 9.7.  Normal folate and B12 level.  Iron panel showed increased ferritin which can be falsely elevated due to acute infection.  Repeat  reticulocyte panel next visit. #Abdomen abscess, she finished her antibiotic course.  Today's CBC showed leukocytosis. She has CT abdomen pelvis scheduled for reevaluation and follows up with Dr.Sakai  We spent sufficient time to discuss many aspect of care, questions were answered to patient's satisfaction. Total face to face encounter time for this patient visit was 60 min. >50% of the time was  spent in counseling and coordination of care.    Orders Placed This Encounter  Procedures  . Retic Panel    Standing Status:   Future    Number of Occurrences:   1    Standing Expiration Date:   02/14/2020  . CBC with Differential/Platelet    Standing Status:   Future    Number of Occurrences:   1    Standing Expiration Date:   02/14/2020  . Comprehensive metabolic panel    Standing Status:   Future    Number of Occurrences:   1    Standing Expiration Date:   02/14/2020  . Lactate dehydrogenase    Standing Status:   Future    Number of Occurrences:   1    Standing Expiration Date:   02/14/2020  . Amb Referral to Nutrition and Diabetic Education    Referral Priority:   Routine    Referral Type:   Consultation    Referral Reason:   Specialty Services Required    Number of Visits Requested:   1  . Ambulatory Referral to Palliative Care    Referral Priority:   Routine    Referral Type:   Consultation    Referral Reason:   Goals of Care    Referred to Provider:   Borders, Kirt Boys, NP    Number of Visits Requested:   1  . Ambulatory referral to Genetics    Referral Priority:   Routine    Referral Type:   Consultation    Referral Reason:   Specialty Services Required    Number of Visits Requested:   1      Earlie Server, MD, PhD Hematology [place] [facility] Pager- [phone] 02/14/2019

## 2019-02-15 ENCOUNTER — Encounter: Payer: Self-pay | Admitting: Radiology

## 2019-02-15 ENCOUNTER — Ambulatory Visit
Admission: RE | Admit: 2019-02-15 | Discharge: 2019-02-15 | Disposition: A | Discharge status: Home / Self Care | Payer: Medicare HMO | Source: Ambulatory Visit | Attending: Surgery | Admitting: Surgery

## 2019-02-15 ENCOUNTER — Telehealth: Payer: Self-pay | Admitting: Genetic Counselor

## 2019-02-15 ENCOUNTER — Other Ambulatory Visit: Payer: Self-pay | Admitting: Surgery

## 2019-02-15 ENCOUNTER — Encounter: Payer: Self-pay | Admitting: *Deleted

## 2019-02-15 ENCOUNTER — Other Ambulatory Visit: Payer: Medicare HMO

## 2019-02-15 DIAGNOSIS — K630 Abscess of intestine: Secondary | ICD-10-CM

## 2019-02-15 HISTORY — PX: IR RADIOLOGIST EVAL & MGMT: IMG5224

## 2019-02-15 MED ORDER — IOPAMIDOL (ISOVUE-300) INJECTION 61%
100.0000 mL | Freq: Once | INTRAVENOUS | Status: AC | PRN
  Administered 2019-02-15: 100 mL via INTRAVENOUS

## 2019-02-15 NOTE — Telephone Encounter (Signed)
Dr. Tasia Catchings is referring Ms. Tonya Crawford for genetic counseling due to a personal history of cancer. I left her daughter, Tonya Crawford, a message to call and schedule this telegenetics visit to be done by phone at their convenience.   Steele Berg, [place], [place] [facility] Counselor Phone: [phone]

## 2019-02-15 NOTE — Progress Notes (Signed)
Chief Complaint: Post surgical abscess  Referring Physician(s): Sakai,Isami  Supervising Physician: Sandi Mariscal  History of Present Illness: Tonya Crawford is a 83 y.o. female with history of urothelial cancer.  She is s/p aborted left nephro ureterectomy by Dr. Lewie Loron on 12/17/18.  She presented to [facility] ED on 01/29/19 with worsening abdominal pain.   Imaging studies were worrisome for perioperative abscess along the left paracolic gutter.   She underwent placement of abdominal abscess drains x 2 (left mid/lower) on 2/16 by Dr. Pascal Lux  She is seen in clinic today for CT scan and drain injection.  Her daughter is with her today. She has kept excellent records regarding output of both drains.  She is not taking any antibiotics.  Her palliative treatment plans includes possible immunotherapy.   Past Medical History:  Diagnosis Date  . Cancer [facility])    Breast Cancer  . Cancer ([place])    Urothilial Cancer  . Hypertension   . Thyroid disease     Past Surgical History:  Procedure Laterality Date  . BREAST LUMPECTOMY  12/12/2003  . IR RADIOLOGIST EVAL & MGMT  02/15/2019  . percutaneous drainage of abdomen abcess  01/30/2019    Allergies: Patient has no known allergies.  Medications: Prior to Admission medications   Medication Sig Start Date End Date Taking? Authorizing Provider  alum & mag hydroxide-simeth (MAALOX/MYLANTA) 200-200-20 MG/5ML suspension Take 30 mLs by mouth every 4 (four) hours as needed for indigestion or heartburn. Patient not taking: Reported on 02/14/2019 02/03/19   Benjamine Sprague, DO  feeding supplement, ENSURE ENLIVE, (ENSURE ENLIVE) LIQD Take 237 mLs by mouth 2 (two) times daily between meals. 02/03/19   Lysle Pearl, Isami, DO  ibuprofen (ADVIL,MOTRIN) 200 MG tablet Take 200 mg by mouth every 6 (six) hours as needed.    [provider]  levothyroxine (SYNTHROID, LEVOTHROID) 88 MCG tablet Take 88 mcg by mouth daily. 01/05/19   [provider]    lisinopril (PRINIVIL,ZESTRIL) 5 MG tablet Take 5 mg by mouth daily. 11/08/18   [provider]  megestrol (MEGACE) 40 MG tablet Take 1 tablet (40 mg total) by mouth 2 (two) times daily. 02/14/19   Earlie Server, MD  multivitamin-lutein [facility]) CAPS capsule Take 1 capsule by mouth daily for 30 days. 02/03/19 03/05/19  Benjamine Sprague, DO  oxyCODONE-acetaminophen (PERCOCET/ROXICET) 5-325 MG tablet Take 1 tablet by mouth every 6 (six) hours as needed for pain. 01/15/19   [provider]  polyethylene glycol (MIRALAX) packet Take 17 g by mouth daily. 02/03/19   Benjamine Sprague, DO     Family History  Problem Relation Age of Onset  . Lung cancer Sister   . Adrenal disorder Sister     Social History   Socioeconomic History  . Marital status: Divorced    Spouse name: Not on file  . Number of children: Not on file  . Years of education: Not on file  . Highest education level: Not on file  Occupational History  . Not on file  Social Needs  . Financial resource strain: Not on file  . Food insecurity:    Worry: Not on file    Inability: Not on file  . Transportation needs:    Medical: Not on file    Non-medical: Not on file  Tobacco Use  . Smoking status: Former Smoker    Types: Cigarettes  . Smokeless tobacco: Never Used  Substance and Sexual Activity  . Alcohol use: Yes    Comment: occasionally  .  Drug use: Not on file  . Sexual activity: Not on file  Lifestyle  . Physical activity:    Days per week: Not on file    Minutes per session: Not on file  . Stress: Not on file  Relationships  . Social connections:    Talks on phone: Not on file    Gets together: Not on file    Attends religious service: Not on file    Active member of club or organization: Not on file    Attends meetings of clubs or organizations: Not on file    Relationship status: Not on file  Other Topics Concern  . Not on file  Social History Narrative  . Not on file   Review of  Systems  Vital Signs: BP (!) 120/53   Pulse 81   Temp 98 F (36.7 C)   SpO2 99%   Physical Exam Awake and alert NAD Left lateral drain in place, some drainage on the dressing (replaced today) with milky bloody output in the gravity bag. Records show ~10-15 mL output per day. Left more medial drain in place. Minimal clear yellowish output in bulb. Records show ~5-10 mL output today, could be flush.    Imaging: Ct Abdomen Pelvis Wo Contrast  Result Date: 01/30/2019 CLINICAL DATA:  83 year old with a recently diagnosed urothelial neoplasm at [facility] in [place]. By report, she underwent laparoscopic surgery for nephrectomy which was aborted due to the findings. She presents now with worsening generalized abdominal pain. She has leukocytosis, elevated lipase, elevated liver function tests, hypoalbuminemia and hypocalcemia on laboratory evaluation. CT scan of the abdomen and pelvis performed 01/29/2019 demonstrates an indeterminate fluid collection along the left pericolic gutter with concern for either vascular or enteric contrast extravasation. Please perform noncontrast abdominal CT for further evaluation of this indeterminate fluid collection. EXAM: CT ABDOMEN AND PELVIS WITHOUT CONTRAST TECHNIQUE: Multidetector CT imaging of the abdomen and pelvis was performed following the standard protocol without IV contrast. COMPARISON:  CT abdomen pelvis-01/29/2019 FINDINGS: Lower chest: Limited visualization the lower thorax demonstrates trace bilateral effusions with associated bibasilar heterogeneous opacities, left greater than right. No discrete focal airspace opacities. Normal heart size. Trace pericardial effusion. There is diffuse decreased attenuation of the intra cardiac blood pool suggestive anemia. Coronary artery calcifications. Hepatobiliary: Known ill-defined hepatic lesions are not well evaluated on present noncontrast examination. Trace amount of perihepatic ascites unchanged.  Suspected cholelithiasis. Otherwise, normal noncontrast appearance the gallbladder. Pancreas: Unchanged appearance of the approximately 1.0 cm hypoattenuating lesion within the mid body of the pancreas (image 27, series 2, incompletely characterized on present examination though potentially representative of a pancreatic cyst/IPMN is not found to demonstrate enhancement contrast-enhanced examination performed day prior. Spleen: Normal noncontrast appearance of the spleen. Adrenals/Urinary Tract: Redemonstrated infiltrative mass involving the left renal pelvis and proximal ureter with associated marked left-sided pelvicaliectasis. Excreted contrast from examination performed day prior remains within the right renal collecting system. No evidence of right-sided urinary obstruction. Minimal amount bilateral perinephric stranding. There is unchanged mild thickening the left mid gland without discrete nodule. Normal noncontrast appearance of the right adrenal gland. Enteric contrast is seen within the urinary bladder. Grossly unchanged appearance of previously identified loculated mixed air and fluid containing collection within the left pericolic gutter with dominant more anteriorly located collection measuring approximately 8.- x 5.9 x 8.9 cm (axial image 45, series 2; 36, series 5) and caudal component measuring approximately 5.5 x 2.6 x 6.8 cm (axial image 51 series 2; 47,  series 5), unchanged, previously, 7.8 x 7.7 x 8.6 cm and 5.8 x 2.8 x 6.6 cm respectively. Previously noted high-density material within the collection has dissipated in the interval. Stomach/Bowel: Ingested enteric contrast is now seen extending to the level of the rectum. Grossly unchanged scattered foci of subcutaneous emphysema. No pneumatosis or portal venous gas. Vascular/Lymphatic: Atherosclerotic plaque within a tortuous but normal caliber abdominal aorta. Reproductive: Normal noncontrast appearance of the pelvic organs. Re-demonstrated  approximately 5.3 x 2.2 cm fluid collection with the pelvic cul-de-sac (image 73, series 2). Other: Redemonstrated subcutaneous emphysema about the body wall, presumably the residua of recent attempted laparoscopic intervention. Musculoskeletal: No acute or aggressive osseous abnormalities. Moderate DDD of L5-S1 and to a lesser extent, L4-L5 with disc space height loss, endplate irregularity small posteriorly directed disc osteophyte complexes at these locations. IMPRESSION: 1. Unchanged loculated mixed air and fluid containing collection within the left pericolic gutter with dissipated of previously noted high density material. Given lack of significant increase in size of the collection as well as proximity of the medial aspect of the collection to the ascending portion of the duodenum on preceding examination, the high-density material within the collection is favored to represented enteric contrast. 2. Unchanged appearance of approximately 5.3 x 2.3 cm collection within the pelvic cul-de-sac 3. Unchanged appearance of known infiltrative left-sided renal mass with associated marked upstream pelvicaliectasis. 4. Grossly unchanged suspected hepatic metastasis 5. Additional ancillary findings as above. 6. Aortic Atherosclerosis (ICD10-I70.0). PLAN: - The patient is to undergo CT-guided aspiration/drainage of loculated left-sided pericolic collections for diagnostic and therapeutic purposes. - Appropriateness of proceeding with left-sided percutaneous nephrostomy placement was discussed with on-call urologist, Dr. Junious Silk, and the decision was made NOT to proceed with image guided nephrostomy catheter placement at this time given concern for track seeding of the malignancy. Electronically Signed   By: Sandi Mariscal M.D.   On: 01/30/2019 10:38   Ct Abdomen Pelvis W Contrast  Result Date: 02/15/2019 CLINICAL DATA:  Recently diagnosed left-sided urethral neoplasm who underwent attempted laparoscopic nephrectomy at an  outside institution, however the procedure was aborted due to findings of advanced disease at the time of the operation. Patient subsequent presented to the [facility] emergency department on 01/29/2019 with abdominal pain with CT of the abdomen and pelvis demonstrating findings worrisome for development of a pericolonic abscess for which she underwent CT-guided placement of 2 percutaneous drainage catheters on 01/30/2019. Patient presents today to the interventional radiology drain Clinic for drainage catheter evaluation and management. EXAM: CT ABDOMEN AND PELVIS WITH CONTRAST TECHNIQUE: Multidetector CT imaging of the abdomen and pelvis was performed using the standard protocol following bolus administration of intravenous contrast. CONTRAST:  134mL ISOVUE-300 IOPAMIDOL (ISOVUE-300) INJECTION 61% COMPARISON:  CT abdomen pelvis-01/30/2019; 01/29/2019; CT-guided percutaneous drainage catheter placement x2-01/29/2019 FINDINGS: Lower chest: Limited visualization of the lower thorax demonstrates minimal dependent subpleural ground-glass atelectasis. No discrete focal airspace opacities. No pleural effusion. Normal heart size.  No pericardial effusion. Hepatobiliary: Previously question ill-defined lesion within the subcapsular aspect the left lobe of the liver appears more conspicuous on the present examination measuring approximately 2.6 x 2.1 cm (image 20, series 2). There is marked mottled perfusion involving the posterior segment of the right lobe of the liver (image 11, series 2) with multiple apparent ill-defined hypoattenuating hepatic lesions with dominant lesion within the subcapsular aspect of the right lobe of the liver measuring approximately 1.8 x 1.5 cm (coronal image 60, series 6) and additional apparent lesion within the dome of the  right lobe of the liver measuring approximately 1.4 x 1.1 cm (coronal image 65, series 6). No intrahepatic biliary duct dilatation. No ascites. Normal appearance of the  gallbladder given degree distention. No radiopaque gallstones. No ascites. Pancreas: Redemonstrated approximately 0.9 cm hypoattenuating (13 Hounsfield unit) presumed pancreatic cyst. No definitive pancreatic ductal dilatation. Spleen: Normal appearance of the spleen. Adrenals/Urinary Tract: Grossly unchanged infiltrative mass involving the left renal pelvis extending to involve the superior aspect the left ureter (representative images 35 and 43, series 2). These findings are again associated with marked upstream pelvicaliectasis and mild asymmetric right-sided perinephric stranding. Normal appearance of the right kidney. Note is again made of a punctate (approximately 0.4 cm) nonobstructing right-sided renal stone. Otherwise, normal appearance of the right kidney. No right-sided urinary obstruction. Normal appearance the bilateral adrenal glands. Stomach/Bowel: Stable positioning of left-sided pericolonic percutaneous drainage catheters with reduction of pericolonic mixed air and fluid collections. Serpiginous collection remains within the left upper abdominal quadrant interposed between the tail of the pancreas, the spleen and kidney with dominant component measuring approximately 5.0 x 2.1 x 2.8 cm (axial image 24, series 2; coronal image 64, series 6). Ill-defined fluid collection within the pelvic cul-de-sac as resolved in the interval. No definitive new definable/drainable fluid collections. Moderate to large colonic stool burden without evidence of enteric obstruction. Normal appearance of the terminal ileum. The appendix is not visualized, however there is no pericecal inflammatory change. No pneumoperitoneum, pneumatosis or portal venous gas. Vascular/Lymphatic: Atherosclerotic plaque within a normal caliber abdominal aorta. The major branch vessels of the abdominal aorta appear patent on this non CTA examination. Unchanged scattered retroperitoneal adenopathy with index left-sided periaortic lymph node  measuring 1.3 cm in greatest short axis diameter (image 36, series 2 unchanged compared to the 01/29/19 20 examination. Reproductive: Normal appearance of the pelvic organs. No free fluid the pelvic cul-de-sac. Other: Regional soft tissues appear normal. Musculoskeletal: Ill-defined lytic lesions are seen within the pelvis (image 56, series 2 as well as scattered within the lumbar spine with index approximately 1 cm lesion within the T12 vertebral body (sagittal image 75, series 8) and additional smaller lesions seen within the L4 and L5 vertebral bodies (image 76, series 8). IMPRESSION: 1. Interval reduction/near resolution left-sided pericolonic complex fluid collections following percutaneous drainage catheter placement. Small (approximately 5 cm) serpiginous fluid collection persists within the left upper abdominal quadrant. 2. Ill-defined fluid collection with the pelvic cul-de-sac has resolved in the interval. No new definable/drainable fluid collections. 3. Similar appearance of known infiltrative mass involving the left renal collecting system superior aspect the left ureter with associated adjacent left-sided retroperitoneal lymphadenopathy. 4. Increased conspicuity of suspected advanced hepatic metastatic disease, potentially artifactual due to phase of enhancement on today's examination, though worrisome. Further evaluation with abdominal MRI could be performed as clinically indicated. 5. Ill-defined lytic lesions within the pelvis and lumbar spine, potentially areas osteopenia/osteoporosis though metastatic disease could have a similar appearance. Further evaluation with lumbar spine MRI could be performed as indicated. PLAN: Patient subsequently underwent fluoroscopic guided injection of both percutaneous drainage catheters. Electronically Signed   By: Sandi Mariscal M.D.   On: 02/15/2019 12:32   Ct Abdomen Pelvis W Contrast  Result Date: 01/29/2019 CLINICAL DATA:  83 year old with a recently diagnosed  urothelial neoplasm at [facility] in [place]. By report, she underwent laparoscopic surgery for nephrectomy which was aborted due to the findings. She presents now with worsening generalized abdominal pain. She has leukocytosis, elevated lipase, elevated liver function tests, hypoalbuminemia  and hypocalcemia on laboratory evaluation. EXAM: CT ABDOMEN AND PELVIS WITH CONTRAST TECHNIQUE: Multidetector CT imaging of the abdomen and pelvis was performed using the standard protocol following bolus administration of intravenous contrast. Delayed imaging through the abdomen and pelvis was also performed. CONTRAST:  44mL OMNIPAQUE IOHEXOL 300 MG/ML IV. Oral contrast was also administered. COMPARISON:  No outside imaging is currently available for comparison. FINDINGS: Lower chest: Small BILATERAL pleural effusions, LEFT greater than RIGHT, with associated passive atelectasis in the lower lobes. Visualized lung bases otherwise clear. Heart size upper normal. No pericardial effusion. Hepatobiliary: Geographic low attenuation scattered throughout the liver. Approximate 1.3 cm mass involving the LEFT lobe (series 2, image 20). No other discrete hepatic masses. Small gallstones in the gallbladder without CT evidence of acute cholecystitis. No biliary ductal dilation. Pancreas: Approximate 1.1 x 1.0 cm well-circumscribed simple cyst arising from the proximal tail of the pancreas. No other pancreatic masses. No peripancreatic edema/inflammation. Spleen: Normal in size and appearance. Small focus of accessory splenic tissue ANTERIOR to the spleen at the hilum. Adrenals/Urinary Tract: Normal appearing adrenal glands. Large mass with its epicenter in the LEFT renal pelvis extending along the length of the proximal and mid ureter in the LEFT retroperitoneum over a length of at least 15 cm, and perhaps involving the entire ureter. The mass causes marked hydronephrosis involving the LEFT kidney with significant obstruction, as  there is delayed contrast excretion by the LEFT kidney and no contrast in the collecting system on the delayed images. Mild RIGHT hydroureteronephrosis, though there is no obstructing stone or mass in the RIGHT ureter, and the ureter can be followed to the bladder on the delayed images. Adjacent non-obstructing RIGHT LOWER pole renal calculi, the largest measuring approximately 4 mm. RIGHT kidney otherwise normal in appearance. Filling defect dependently in the urinary bladder on the delayed images is likely blood clot; this clot is adjacent to the RIGHT ureteral orifice and may account for the mild hydroureteronephrosis. Stomach/Bowel: Stomach normal in appearance for the degree of distention. Normal-appearing small bowel, with upper normal caliber distal jejunal loops. Marked thickening of the wall of the descending colon, sigmoid colon and rectum. Sigmoid colon diverticulosis. Mobile cecum positioned in the RIGHT UPPER QUADRANT of the abdomen. Lipoma involving the ileocecal valve. Appendix not reliably identified, but no evidence of pericecal inflammation. Vascular/Lymphatic: Moderate aortoiliofemoral atherosclerosis without evidence of aneurysm. LEFT periaortic retroperitoneal lymphadenopathy adjacent to the urothelial neoplasm, the largest node measuring approximately 1.7 x 1.0 cm. Scattered normal sized lymph nodes elsewhere in the retroperitoneum. No pathologic lymphadenopathy elsewhere. Reproductive: Normal appearing uterus.  No adnexal masses. Other: Bilobed fluid collection with an enhancing wall containing gas in the LEFT mid abdomen, adjacent to the mid descending colon, measuring approximately 9 x 7.5 x 9.5 cm. There is high attenuation contrast material within this collection and since there is no oral contrast in the adjacent bowel, I believe this is extravasated vascular contrast from a branch of what I believe is the INFERIOR mesenteric vein. Fluid collection with enhancing wall in the dependent  portion of the pelvis measuring approximately 5 x 2 x 2 cm. Musculoskeletal: No acute findings. No evidence of osseous metastatic disease. Degenerative disc disease at L4-5 and L5-S1. Bone islands in the first sacral segment. IMPRESSION: 1. Large urothelial neoplasm involving the LEFT renal pelvis and the LEFT ureter, likely along its entire length, causing marked LEFT UPPER urinary tract obstruction. Adjacent LEFT periaortic retroperitoneal metastatic lymphadenopathy. 2. Large abscess in the LEFT mid abdomen, adjacent to  the mid descending colon, with extravasated likely vascular contrast within the collection. It would appear that the contrast arises from an INFERIOR mesenteric vessel, likely a branch of the INFERIOR mesenteric vein. 3. Smaller abscess in the dependent portion of the pelvis. 4. Descending and sigmoid colitis and proctitis. Sigmoid colon diverticulosis. 5. Filling defect in the urinary bladder on delayed imaging indicating a blood clot. This is causing partial obstruction of the RIGHT urinary tract, as there is mild hydroureteronephrosis but no obstructing stone or mass. 6. Non-obstructing approximate 4 mm calculus or adjacent calculi in a LOWER pole calyx of the RIGHT kidney. 7. Geographic areas of hepatic steatosis. Indeterminate solitary approximate 1.2 cm lesion involving the LEFT lobe of liver. 8. Approximate 1 cm simple cyst arising from the proximal tail of the pancreas. This is most likely benign, given its appearance. This can be evaluated on follow-up CT examinations during her course of therapy. 9. Cholelithiasis without sonographic evidence of acute cholecystitis. 10. Small BILATERAL pleural effusions, LEFT greater than RIGHT, and associated mild passive atelectasis involving the lower lobes. Aortic Atherosclerosis (ICD10-170.0) I would telephoned these results directly to Dr. Burlene Arnt of the emergency department at the time of interpretation on 01/29/2019 at 3:30 p.m. Electronically  Signed   By: Evangeline Dakin M.D.   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 EXAM: ABSCESS INJECTION COMPARISON:  CT abdomen pelvis-earlier same day; 01/30/2019; 01/29/2019; CT-guided percutaneous drainage catheter placement x2-01/30/2019 CONTRAST:  A total of 20 cc Omnipaque 300, was administered via both percutaneous drainage catheters. FLUOROSCOPY TIME:  1 minute (20 mGy) TECHNIQUE: The patient was positioned supine on the fluoroscopy table. A preprocedural spot fluoroscopic image was obtained of the left lower abdomen and existing percutaneous drainage catheters. Multiple spot fluoroscopic and radiographic images were obtained following the injection of a small amount of contrast via the existing percutaneous drainage catheter. Images were reviewed and the decision was made to remove the more inferiorly located percutaneous catheter. As such, the external portion of the drainage catheter was cut and drainage catheters removed intact. A dressing was placed. The patient tolerated the procedure well without immediate postprocedural complication. FINDINGS: Preprocedural spot fluoroscopic image demonstrates unchanged positioning of both left-sided percutaneous drainage catheters. Fluoroscopic guided injection of the more inferiorly located percutaneous drainage catheter demonstrates opacification of decompressed abscess cavity without evidence of fistulous connection to the intestines or communication with the adjacent percutaneous drainage catheter. Given this finding, as well as resolution of the caudal component of the left-sided pericolonic abscess on preceding abdominal CT, the decision was made to remove the percutaneous drainage catheter which was done at the patient's bedside without incident. Contrast  injection of the more superiorly located previous drainage catheter demonstrates opacification of a decompressed ill-defined abscess cavity with eventual serpiginous communication to the adjacent small bowel. IMPRESSION: 1. Contrast injection of the more superior percutaneous drainage catheter demonstrates opacification of decompressed abscess cavity with fistulous connection to an adjacent loop of small bowel. 2. Contrast injection of the more inferior percutaneous drainage catheter demonstrates resolved pericolonic abscess without evidence of fistula. The more inferior percutaneous drainage catheter was removed at the patient's bedside. PLAN: - The patient and the patient's daughter were instructed to no longer flush the remaining percutaneous drainage catheter but to remain diligent records regarding drainage catheter output. - The patient will return to the  interventional radiology drain clinic in 2 weeks for repeat drainage catheter injection (no CT). The patient and the patient's daughter know to call the interventional radiology drain clinic with any interval questions or concerns. Electronically Signed   By: Sandi Mariscal M.D.   On: 02/15/2019 12:39   Ct Image Guided Drainage By Percutaneous Catheter  Result Date: 01/30/2019 INDICATION: 83 year old with a recently diagnosed urothelial neoplasm at [facility] in [place]. By report, she underwent laparoscopic surgery for nephrectomy which was aborted due to the findings. She presented to the [facility] emergency department on 01/29/2019 with CT scan of the abdomen and pelvis demonstrating an indeterminate fluid collection along the left pericolic gutter, worrisome for postoperative abscess confirmed with subsequent noncontrast CT scan performed 01/30/2019. Patient presents now for CT-guided percutaneous drainage catheter placement for infection source control purposes. EXAM: CT IMAGE GUIDED DRAINAGE BY PERCUTANEOUS CATHETER x 2 COMPARISON:  CT abdomen  pelvis-01/29/2019; 01/30/2019 MEDICATIONS: The patient is currently admitted to the hospital and receiving intravenous antibiotics. The antibiotics were administered within an appropriate time frame prior to the initiation of the procedure. ANESTHESIA/SEDATION: Moderate (conscious) sedation was employed during this procedure. A total of Versed 1 mg and Fentanyl 50 mcg was administered intravenously. Moderate Sedation Time: 26 minutes. The patient's level of consciousness and vital signs were monitored continuously by radiology nursing throughout the procedure under my direct supervision. CONTRAST:  None COMPLICATIONS: None immediate. PROCEDURE: Informed written consent was obtained from the patient after a discussion of the risks, benefits and alternatives to treatment. The patient was placed supine on the CT gantry and a pre procedural CT was performed re-demonstrating the known abscess/fluid collection within the left mid hemiabdomen measuring approximately 7.9 x 5.6 cm (image 58, series 2) and additional component within the motor lower abdomen/pelvis measuring approximately 6.0 x 2.7 cm (image 65, series 2). The procedure was planned. A timeout was performed prior to the initiation of the procedure. The skin overlying the left mid and lower abdomen was prepped and draped in the usual sterile fashion. The overlying soft tissues were anesthetized with 1% lidocaine with epinephrine. 18 gauge trocar needles were utilized to coil short Amplatz wires within both collections. Propria position was confirmed with CT imaging. Initially, the track was dilated ultimately allowing placement of a 10 [place] all-purpose drainage catheter into the dominant abscess within the left mid hemiabdomen favored to be the collection most proximal to the expected location of enteric leak adjacent to the ascending portion of the duodenum. Following drainage catheter placement approximately 100 cc of purulent material was aspirated Repeat  CT scan was performed demonstrating a persistent collection with the left lower abdomen/pelvis and as such decision was made to place an additional drainage catheter at this location. Again, the track was dilated allowing placement of a additional 10 French percutaneous drainage catheter. Next, approximately 30 cc of purulent material was aspirated. Final catheter positioning was confirmed with CT imaging and both catheters were flushed with a small amount of saline. The dominant collection adjacent to the expected location of the enteric leak was connected to a gravity bag, while the additional catheter located within the lower abdomen/pelvis was connected to a JP bulb. Dressings were applied. The patient tolerated the procedure well without immediate postprocedural complication. IMPRESSION: 1. Successful CT guided placement of a 10 [place] all purpose drain catheter into the dominant collection with the left mid hemiabdomen with aspiration of 100 cc of purulent material. This drainage catheter is felt to be located adjacent to  the expected location of enteric leak associated with the ascending portion of the duodenum and as such was connected to a gravity bag. 2. Successful CT-guided placement of a 10 French all-purpose drainage catheter into remaining abscess within the left lower abdomen/pelvis with aspiration of 100 cc of purulent material. This drainage catheter was connected to a JP bulb. PLAN: - Recommend flushing both percutaneous drainage catheters with 10 cc saline per day. - Maintain diligent records regarding output from both drainage catheters - Once the output from one or both drainage catheters has decreased the less than 10 cc per day (excluding flush), repeat CT imaging may be performed as indicated. - Recommend drainage catheter injection prior to drainage catheter removal. Electronically Signed   By: Sandi Mariscal M.D.   On: 01/30/2019 17:51   Ct Image Guided Drainage By Percutaneous  Catheter  Result Date: 01/30/2019 INDICATION: 83 year old with a recently diagnosed urothelial neoplasm at [facility] in [place]. By report, she underwent laparoscopic surgery for nephrectomy which was aborted due to the findings. She presented to the [facility] emergency department on 01/29/2019 with CT scan of the abdomen and pelvis demonstrating an indeterminate fluid collection along the left pericolic gutter, worrisome for postoperative abscess confirmed with subsequent noncontrast CT scan performed 01/30/2019. Patient presents now for CT-guided percutaneous drainage catheter placement for infection source control purposes. EXAM: CT IMAGE GUIDED DRAINAGE BY PERCUTANEOUS CATHETER x 2 COMPARISON:  CT abdomen pelvis-01/29/2019; 01/30/2019 MEDICATIONS: The patient is currently admitted to the hospital and receiving intravenous antibiotics. The antibiotics were administered within an appropriate time frame prior to the initiation of the procedure. ANESTHESIA/SEDATION: Moderate (conscious) sedation was employed during this procedure. A total of Versed 1 mg and Fentanyl 50 mcg was administered intravenously. Moderate Sedation Time: 26 minutes. The patient's level of consciousness and vital signs were monitored continuously by radiology nursing throughout the procedure under my direct supervision. CONTRAST:  None COMPLICATIONS: None immediate. PROCEDURE: Informed written consent was obtained from the patient after a discussion of the risks, benefits and alternatives to treatment. The patient was placed supine on the CT gantry and a pre procedural CT was performed re-demonstrating the known abscess/fluid collection within the left mid hemiabdomen measuring approximately 7.9 x 5.6 cm (image 58, series 2) and additional component within the motor lower abdomen/pelvis measuring approximately 6.0 x 2.7 cm (image 65, series 2). The procedure was planned. A timeout was performed prior to the initiation of the procedure.  The skin overlying the left mid and lower abdomen was prepped and draped in the usual sterile fashion. The overlying soft tissues were anesthetized with 1% lidocaine with epinephrine. 18 gauge trocar needles were utilized to coil short Amplatz wires within both collections. Propria position was confirmed with CT imaging. Initially, the track was dilated ultimately allowing placement of a 10 [place] all-purpose drainage catheter into the dominant abscess within the left mid hemiabdomen favored to be the collection most proximal to the expected location of enteric leak adjacent to the ascending portion of the duodenum. Following drainage catheter placement approximately 100 cc of purulent material was aspirated Repeat CT scan was performed demonstrating a persistent collection with the left lower abdomen/pelvis and as such decision was made to place an additional drainage catheter at this location. Again, the track was dilated allowing placement of a additional 10 French percutaneous drainage catheter. Next, approximately 30 cc of purulent material was aspirated. Final catheter positioning was confirmed with CT imaging and both catheters were flushed with a small amount of  saline. The dominant collection adjacent to the expected location of the enteric leak was connected to a gravity bag, while the additional catheter located within the lower abdomen/pelvis was connected to a JP bulb. Dressings were applied. The patient tolerated the procedure well without immediate postprocedural complication. IMPRESSION: 1. Successful CT guided placement of a 10 [place] all purpose drain catheter into the dominant collection with the left mid hemiabdomen with aspiration of 100 cc of purulent material. This drainage catheter is felt to be located adjacent to the expected location of enteric leak associated with the ascending portion of the duodenum and as such was connected to a gravity bag. 2. Successful CT-guided placement of a 10  French all-purpose drainage catheter into remaining abscess within the left lower abdomen/pelvis with aspiration of 100 cc of purulent material. This drainage catheter was connected to a JP bulb. PLAN: - Recommend flushing both percutaneous drainage catheters with 10 cc saline per day. - Maintain diligent records regarding output from both drainage catheters - Once the output from one or both drainage catheters has decreased the less than 10 cc per day (excluding flush), repeat CT imaging may be performed as indicated. - Recommend drainage catheter injection prior to drainage catheter removal. Electronically Signed   By: Sandi Mariscal M.D.   On: 01/30/2019 17:51   Ir Radiologist Eval & Mgmt  Result Date: 02/15/2019 Please refer to notes tab for details about interventional procedure. (Op Note)   Labs:  CBC: Recent Labs    02/01/19 0407 02/02/19 0342 02/03/19 0431 02/14/19 1150  WBC 6.4 5.2 6.5 10.7*  HGB 8.7* 8.9* 8.9* 9.7*  HCT 28.1* 29.1* 29.5* 31.5*  PLT 324 342 338 246    COAGS: Recent Labs    01/29/19 1641  INR 1.07    BMP: Recent Labs    02/01/19 0407 02/02/19 0342 02/03/19 0431 02/14/19 1150  NA 134* 136 135 132*  K 3.8 4.4 4.2 4.7  CL 106 106 107 99  CO2 26 24 23 25   GLUCOSE 121* 117* 95 87  BUN 13 10 10 22   CALCIUM 7.2* 7.5* 7.4* 8.2*  CREATININE 0.95 0.94 0.84 1.02*  GFRNONAA 57* 57* >60 52*  GFRAA >60 >60 >60 >60    LIVER FUNCTION TESTS: Recent Labs    01/29/19 1023 02/14/19 1150  BILITOT 1.5* 1.1  AST 79* 42*  ALT 72* 33  ALKPHOS 426* 495*  PROT 6.4* 7.2  ALBUMIN 2.2* 2.5*    TUMOR MARKERS: No results for input(s): AFPTM, CEA, CA199, CHROMGRNA in the last 8760 hours.  Assessment/Plan:  Post surgical abscess  S/P abdominal abscess drains x 2 (left mid/lower) on 2/16 by Dr. Pascal Lux.  CT and drain injection images reviewed by Dr. Pascal Lux.  There appears to be advancement of disease noted in the liver.  The anterior abscess is resolved. The  lateral aspect shows fistulous communication.  The anterior drain was removed today. The lateral drain will remain in place.  The daughter was instructed to STOP flushing the drain. A new gravity bag and new stat-lock and fresh dressings applied.  Dr. Pascal Lux discussed findings with the daughter.  Plan is to return in 2 weeks with repeat drain injection.   Electronically Signed: Murrell Redden PA-C 02/15/2019, 12:51 PM    Please refer to Dr. Deniece Portela attestation of this note for management and plan.

## 2019-02-16 ENCOUNTER — Inpatient Hospital Stay: Payer: Medicare HMO

## 2019-02-16 ENCOUNTER — Other Ambulatory Visit: Payer: Self-pay | Admitting: *Deleted

## 2019-02-16 ENCOUNTER — Telehealth: Payer: Self-pay | Admitting: *Deleted

## 2019-02-16 ENCOUNTER — Inpatient Hospital Stay (HOSPITAL_BASED_OUTPATIENT_CLINIC_OR_DEPARTMENT_OTHER): Payer: Medicare HMO | Admitting: Hospice and Palliative Medicine

## 2019-02-16 VITALS — BP 110/79 | HR 82 | Temp 98.0°F | Resp 18

## 2019-02-16 DIAGNOSIS — I10 Essential (primary) hypertension: Secondary | ICD-10-CM

## 2019-02-16 DIAGNOSIS — C689 Malignant neoplasm of urinary organ, unspecified: Secondary | ICD-10-CM

## 2019-02-16 DIAGNOSIS — G893 Neoplasm related pain (acute) (chronic): Secondary | ICD-10-CM

## 2019-02-16 DIAGNOSIS — Z853 Personal history of malignant neoplasm of breast: Secondary | ICD-10-CM

## 2019-02-16 DIAGNOSIS — G4700 Insomnia, unspecified: Secondary | ICD-10-CM | POA: Diagnosis not present

## 2019-02-16 DIAGNOSIS — Z7189 Other specified counseling: Secondary | ICD-10-CM

## 2019-02-16 DIAGNOSIS — R630 Anorexia: Secondary | ICD-10-CM

## 2019-02-16 DIAGNOSIS — Z515 Encounter for palliative care: Secondary | ICD-10-CM

## 2019-02-16 DIAGNOSIS — E860 Dehydration: Secondary | ICD-10-CM

## 2019-02-16 MED ORDER — SODIUM CHLORIDE 0.9 % IV SOLN
Freq: Once | INTRAVENOUS | Status: AC
  Administered 2019-02-16: 14:00:00 via INTRAVENOUS
  Filled 2019-02-16: qty 250

## 2019-02-16 MED ORDER — MIRTAZAPINE 7.5 MG PO TABS: 7.5000 mg | ORAL_TABLET | Freq: Every day | ORAL | 2 refills | Status: AC

## 2019-02-16 NOTE — Progress Notes (Signed)
[place]  [phone] [phone]   Name: Tonya Crawford Date: 02/16/2019 MRN: [id number]  DOB: 04-04-1936  Patient Care Team: Arnetha Courser, MD as PCP - General (Family Medicine)    REASON FOR CONSULTATION: Palliative Care consult requested for this 83 y.o. female with multiple medical problems including stage IV urothelial cancer, hydronephrosis status post ureteral stenting.  Patient underwent planned laparoscopic left nephrectomy on 01/13/2019 but procedure was aborted due to the presence of hard tissue surrounding the ureter.  Patient declined following discharge from the hospital.  She later presented to the ER with abdominal pain and fatigue and was found to have a abscess in the pelvis requiring percutaneous catheter placement for drainage.  PMH also notable for history of stage IIIa right breast cancer status post lumpectomy, hypertension, and thyroid disease.  Patient has had fatigue and weight loss.  Patient is felt not to be a candidate for chemotherapy due to poor performance status.  She was referred to palliative care to help address symptoms and goals.  SOCIAL HISTORY:    Patient is originally from [place] but moved to [place] to live with her daughter. She is divorced. Patient has four adult children. She is a retired Therapist, sports.   ADVANCE DIRECTIVES:  Does not have  CODE STATUS: DNR (MOST form completed on 02/16/19)  PAST MEDICAL HISTORY: Past Medical History:  Diagnosis Date  . Cancer [facility])    Breast Cancer  . Cancer ([place])    Urothilial Cancer  . Hypertension   . Thyroid disease     PAST SURGICAL HISTORY:  Past Surgical History:  Procedure Laterality Date  . BREAST LUMPECTOMY  12/12/2003  . IR RADIOLOGIST EVAL & MGMT  02/15/2019  . percutaneous drainage of abdomen abcess  01/30/2019    HEMATOLOGY/ONCOLOGY HISTORY:   No history exists.    ALLERGIES:  has No Known Allergies.  MEDICATIONS:  Current  Outpatient Medications  Medication Sig Dispense Refill  . alum & mag hydroxide-simeth (MAALOX/MYLANTA) 200-200-20 MG/5ML suspension Take 30 mLs by mouth every 4 (four) hours as needed for indigestion or heartburn. (Patient not taking: Reported on 02/14/2019) 355 mL 0  . feeding supplement, ENSURE ENLIVE, (ENSURE ENLIVE) LIQD Take 237 mLs by mouth 2 (two) times daily between meals. 237 mL 12  . ibuprofen (ADVIL,MOTRIN) 200 MG tablet Take 200 mg by mouth every 6 (six) hours as needed.    Marland Kitchen levothyroxine (SYNTHROID, LEVOTHROID) 88 MCG tablet Take 88 mcg by mouth daily.    Marland Kitchen lisinopril (PRINIVIL,ZESTRIL) 5 MG tablet Take 5 mg by mouth daily.    . megestrol (MEGACE) 40 MG tablet Take 1 tablet (40 mg total) by mouth 2 (two) times daily. 60 tablet 0  . multivitamin-lutein (OCUVITE-LUTEIN) CAPS capsule Take 1 capsule by mouth daily for 30 days. 30 capsule 0  . oxyCODONE-acetaminophen (PERCOCET/ROXICET) 5-325 MG tablet Take 1 tablet by mouth every 6 (six) hours as needed for pain.    . polyethylene glycol (MIRALAX) packet Take 17 g by mouth daily. 14 each 0   No current facility-administered medications for this visit.    Facility-Administered Medications Ordered in Other Visits  Medication Dose Route Frequency Provider Last Rate Last Dose  . 0.9 %  sodium chloride infusion   Intravenous Once Earlie Server, MD        VITAL SIGNS: There were no vitals taken for this visit. There were no vitals filed for this visit.  Estimated body mass index is 17.92  kg/m as calculated from the following:   Height as of 01/30/19: 5' 6.5" (1.689 m).   Weight as of 02/14/19: 112 lb 11.2 oz (51.1 kg).  LABS: CBC:    Component Value Date/Time   WBC 10.7 (H) 02/14/2019 1150   HGB 9.7 (L) 02/14/2019 1150   HCT 31.5 (L) 02/14/2019 1150   PLT 246 02/14/2019 1150   MCV 87.5 02/14/2019 1150   NEUTROABS 8.7 (H) 02/14/2019 1150   LYMPHSABS 1.2 02/14/2019 1150   MONOABS 0.6 02/14/2019 1150   EOSABS 0.0 02/14/2019 1150    BASOSABS 0.0 02/14/2019 1150   Comprehensive Metabolic Panel:    Component Value Date/Time   NA 132 (L) 02/14/2019 1150   K 4.7 02/14/2019 1150   CL 99 02/14/2019 1150   CO2 25 02/14/2019 1150   BUN 22 02/14/2019 1150   CREATININE 1.02 (H) 02/14/2019 1150   GLUCOSE 87 02/14/2019 1150   CALCIUM 8.2 (L) 02/14/2019 1150   AST 42 (H) 02/14/2019 1150   ALT 33 02/14/2019 1150   ALKPHOS 495 (H) 02/14/2019 1150   BILITOT 1.1 02/14/2019 1150   PROT 7.2 02/14/2019 1150   ALBUMIN 2.5 (L) 02/14/2019 1150    RADIOGRAPHIC STUDIES: Ct Abdomen Pelvis Wo Contrast  Result Date: 01/30/2019 CLINICAL DATA:  83 year old with a recently diagnosed urothelial neoplasm at [facility] in [place]. By report, she underwent laparoscopic surgery for nephrectomy which was aborted due to the findings. She presents now with worsening generalized abdominal pain. She has leukocytosis, elevated lipase, elevated liver function tests, hypoalbuminemia and hypocalcemia on laboratory evaluation. CT scan of the abdomen and pelvis performed 01/29/2019 demonstrates an indeterminate fluid collection along the left pericolic gutter with concern for either vascular or enteric contrast extravasation. Please perform noncontrast abdominal CT for further evaluation of this indeterminate fluid collection. EXAM: CT ABDOMEN AND PELVIS WITHOUT CONTRAST TECHNIQUE: Multidetector CT imaging of the abdomen and pelvis was performed following the standard protocol without IV contrast. COMPARISON:  CT abdomen pelvis-01/29/2019 FINDINGS: Lower chest: Limited visualization the lower thorax demonstrates trace bilateral effusions with associated bibasilar heterogeneous opacities, left greater than right. No discrete focal airspace opacities. Normal heart size. Trace pericardial effusion. There is diffuse decreased attenuation of the intra cardiac blood pool suggestive anemia. Coronary artery calcifications. Hepatobiliary: Known ill-defined hepatic  lesions are not well evaluated on present noncontrast examination. Trace amount of perihepatic ascites unchanged. Suspected cholelithiasis. Otherwise, normal noncontrast appearance the gallbladder. Pancreas: Unchanged appearance of the approximately 1.0 cm hypoattenuating lesion within the mid body of the pancreas (image 27, series 2, incompletely characterized on present examination though potentially representative of a pancreatic cyst/IPMN is not found to demonstrate enhancement contrast-enhanced examination performed day prior. Spleen: Normal noncontrast appearance of the spleen. Adrenals/Urinary Tract: Redemonstrated infiltrative mass involving the left renal pelvis and proximal ureter with associated marked left-sided pelvicaliectasis. Excreted contrast from examination performed day prior remains within the right renal collecting system. No evidence of right-sided urinary obstruction. Minimal amount bilateral perinephric stranding. There is unchanged mild thickening the left mid gland without discrete nodule. Normal noncontrast appearance of the right adrenal gland. Enteric contrast is seen within the urinary bladder. Grossly unchanged appearance of previously identified loculated mixed air and fluid containing collection within the left pericolic gutter with dominant more anteriorly located collection measuring approximately 8.- x 5.9 x 8.9 cm (axial image 45, series 2; 36, series 5) and caudal component measuring approximately 5.5 x 2.6 x 6.8 cm (axial image 51 series 2; 47, series 5), unchanged,  previously, 7.8 x 7.7 x 8.6 cm and 5.8 x 2.8 x 6.6 cm respectively. Previously noted high-density material within the collection has dissipated in the interval. Stomach/Bowel: Ingested enteric contrast is now seen extending to the level of the rectum. Grossly unchanged scattered foci of subcutaneous emphysema. No pneumatosis or portal venous gas. Vascular/Lymphatic: Atherosclerotic plaque within a tortuous but  normal caliber abdominal aorta. Reproductive: Normal noncontrast appearance of the pelvic organs. Re-demonstrated approximately 5.3 x 2.2 cm fluid collection with the pelvic cul-de-sac (image 73, series 2). Other: Redemonstrated subcutaneous emphysema about the body wall, presumably the residua of recent attempted laparoscopic intervention. Musculoskeletal: No acute or aggressive osseous abnormalities. Moderate DDD of L5-S1 and to a lesser extent, L4-L5 with disc space height loss, endplate irregularity small posteriorly directed disc osteophyte complexes at these locations. IMPRESSION: 1. Unchanged loculated mixed air and fluid containing collection within the left pericolic gutter with dissipated of previously noted high density material. Given lack of significant increase in size of the collection as well as proximity of the medial aspect of the collection to the ascending portion of the duodenum on preceding examination, the high-density material within the collection is favored to represented enteric contrast. 2. Unchanged appearance of approximately 5.3 x 2.3 cm collection within the pelvic cul-de-sac 3. Unchanged appearance of known infiltrative left-sided renal mass with associated marked upstream pelvicaliectasis. 4. Grossly unchanged suspected hepatic metastasis 5. Additional ancillary findings as above. 6. Aortic Atherosclerosis (ICD10-I70.0). PLAN: - The patient is to undergo CT-guided aspiration/drainage of loculated left-sided pericolic collections for diagnostic and therapeutic purposes. - Appropriateness of proceeding with left-sided percutaneous nephrostomy placement was discussed with on-call urologist, Dr. Junious Silk, and the decision was made NOT to proceed with image guided nephrostomy catheter placement at this time given concern for track seeding of the malignancy. Electronically Signed   By: Sandi Mariscal M.D.   On: 01/30/2019 10:38   Ct Abdomen Pelvis W Contrast  Result Date:  02/15/2019 CLINICAL DATA:  Recently diagnosed left-sided urethral neoplasm who underwent attempted laparoscopic nephrectomy at an outside institution, however the procedure was aborted due to findings of advanced disease at the time of the operation. Patient subsequent presented to the [facility] emergency department on 01/29/2019 with abdominal pain with CT of the abdomen and pelvis demonstrating findings worrisome for development of a pericolonic abscess for which she underwent CT-guided placement of 2 percutaneous drainage catheters on 01/30/2019. Patient presents today to the interventional radiology drain Clinic for drainage catheter evaluation and management. EXAM: CT ABDOMEN AND PELVIS WITH CONTRAST TECHNIQUE: Multidetector CT imaging of the abdomen and pelvis was performed using the standard protocol following bolus administration of intravenous contrast. CONTRAST:  151m ISOVUE-300 IOPAMIDOL (ISOVUE-300) INJECTION 61% COMPARISON:  CT abdomen pelvis-01/30/2019; 01/29/2019; CT-guided percutaneous drainage catheter placement x2-01/29/2019 FINDINGS: Lower chest: Limited visualization of the lower thorax demonstrates minimal dependent subpleural ground-glass atelectasis. No discrete focal airspace opacities. No pleural effusion. Normal heart size.  No pericardial effusion. Hepatobiliary: Previously question ill-defined lesion within the subcapsular aspect the left lobe of the liver appears more conspicuous on the present examination measuring approximately 2.6 x 2.1 cm (image 20, series 2). There is marked mottled perfusion involving the posterior segment of the right lobe of the liver (image 11, series 2) with multiple apparent ill-defined hypoattenuating hepatic lesions with dominant lesion within the subcapsular aspect of the right lobe of the liver measuring approximately 1.8 x 1.5 cm (coronal image 60, series 6) and additional apparent lesion within the dome of the right lobe of  the liver measuring  approximately 1.4 x 1.1 cm (coronal image 65, series 6). No intrahepatic biliary duct dilatation. No ascites. Normal appearance of the gallbladder given degree distention. No radiopaque gallstones. No ascites. Pancreas: Redemonstrated approximately 0.9 cm hypoattenuating (13 Hounsfield unit) presumed pancreatic cyst. No definitive pancreatic ductal dilatation. Spleen: Normal appearance of the spleen. Adrenals/Urinary Tract: Grossly unchanged infiltrative mass involving the left renal pelvis extending to involve the superior aspect the left ureter (representative images 35 and 43, series 2). These findings are again associated with marked upstream pelvicaliectasis and mild asymmetric right-sided perinephric stranding. Normal appearance of the right kidney. Note is again made of a punctate (approximately 0.4 cm) nonobstructing right-sided renal stone. Otherwise, normal appearance of the right kidney. No right-sided urinary obstruction. Normal appearance the bilateral adrenal glands. Stomach/Bowel: Stable positioning of left-sided pericolonic percutaneous drainage catheters with reduction of pericolonic mixed air and fluid collections. Serpiginous collection remains within the left upper abdominal quadrant interposed between the tail of the pancreas, the spleen and kidney with dominant component measuring approximately 5.0 x 2.1 x 2.8 cm (axial image 24, series 2; coronal image 64, series 6). Ill-defined fluid collection within the pelvic cul-de-sac as resolved in the interval. No definitive new definable/drainable fluid collections. Moderate to large colonic stool burden without evidence of enteric obstruction. Normal appearance of the terminal ileum. The appendix is not visualized, however there is no pericecal inflammatory change. No pneumoperitoneum, pneumatosis or portal venous gas. Vascular/Lymphatic: Atherosclerotic plaque within a normal caliber abdominal aorta. The major branch vessels of the abdominal aorta  appear patent on this non CTA examination. Unchanged scattered retroperitoneal adenopathy with index left-sided periaortic lymph node measuring 1.3 cm in greatest short axis diameter (image 36, series 2 unchanged compared to the 01/29/19 20 examination. Reproductive: Normal appearance of the pelvic organs. No free fluid the pelvic cul-de-sac. Other: Regional soft tissues appear normal. Musculoskeletal: Ill-defined lytic lesions are seen within the pelvis (image 56, series 2 as well as scattered within the lumbar spine with index approximately 1 cm lesion within the T12 vertebral body (sagittal image 75, series 8) and additional smaller lesions seen within the L4 and L5 vertebral bodies (image 76, series 8). IMPRESSION: 1. Interval reduction/near resolution left-sided pericolonic complex fluid collections following percutaneous drainage catheter placement. Small (approximately 5 cm) serpiginous fluid collection persists within the left upper abdominal quadrant. 2. Ill-defined fluid collection with the pelvic cul-de-sac has resolved in the interval. No new definable/drainable fluid collections. 3. Similar appearance of known infiltrative mass involving the left renal collecting system superior aspect the left ureter with associated adjacent left-sided retroperitoneal lymphadenopathy. 4. Increased conspicuity of suspected advanced hepatic metastatic disease, potentially artifactual due to phase of enhancement on today's examination, though worrisome. Further evaluation with abdominal MRI could be performed as clinically indicated. 5. Ill-defined lytic lesions within the pelvis and lumbar spine, potentially areas osteopenia/osteoporosis though metastatic disease could have a similar appearance. Further evaluation with lumbar spine MRI could be performed as indicated. PLAN: Patient subsequently underwent fluoroscopic guided injection of both percutaneous drainage catheters. Electronically Signed   By: Sandi Mariscal M.D.    On: 02/15/2019 12:32   Ct Abdomen Pelvis W Contrast  Result Date: 01/29/2019 CLINICAL DATA:  83 year old with a recently diagnosed urothelial neoplasm at [facility] in [place]. By report, she underwent laparoscopic surgery for nephrectomy which was aborted due to the findings. She presents now with worsening generalized abdominal pain. She has leukocytosis, elevated lipase, elevated liver function tests, hypoalbuminemia and hypocalcemia on  laboratory evaluation. EXAM: CT ABDOMEN AND PELVIS WITH CONTRAST TECHNIQUE: Multidetector CT imaging of the abdomen and pelvis was performed using the standard protocol following bolus administration of intravenous contrast. Delayed imaging through the abdomen and pelvis was also performed. CONTRAST:  74m OMNIPAQUE IOHEXOL 300 MG/ML IV. Oral contrast was also administered. COMPARISON:  No outside imaging is currently available for comparison. FINDINGS: Lower chest: Small BILATERAL pleural effusions, LEFT greater than RIGHT, with associated passive atelectasis in the lower lobes. Visualized lung bases otherwise clear. Heart size upper normal. No pericardial effusion. Hepatobiliary: Geographic low attenuation scattered throughout the liver. Approximate 1.3 cm mass involving the LEFT lobe (series 2, image 20). No other discrete hepatic masses. Small gallstones in the gallbladder without CT evidence of acute cholecystitis. No biliary ductal dilation. Pancreas: Approximate 1.1 x 1.0 cm well-circumscribed simple cyst arising from the proximal tail of the pancreas. No other pancreatic masses. No peripancreatic edema/inflammation. Spleen: Normal in size and appearance. Small focus of accessory splenic tissue ANTERIOR to the spleen at the hilum. Adrenals/Urinary Tract: Normal appearing adrenal glands. Large mass with its epicenter in the LEFT renal pelvis extending along the length of the proximal and mid ureter in the LEFT retroperitoneum over a length of at least 15 cm, and  perhaps involving the entire ureter. The mass causes marked hydronephrosis involving the LEFT kidney with significant obstruction, as there is delayed contrast excretion by the LEFT kidney and no contrast in the collecting system on the delayed images. Mild RIGHT hydroureteronephrosis, though there is no obstructing stone or mass in the RIGHT ureter, and the ureter can be followed to the bladder on the delayed images. Adjacent non-obstructing RIGHT LOWER pole renal calculi, the largest measuring approximately 4 mm. RIGHT kidney otherwise normal in appearance. Filling defect dependently in the urinary bladder on the delayed images is likely blood clot; this clot is adjacent to the RIGHT ureteral orifice and may account for the mild hydroureteronephrosis. Stomach/Bowel: Stomach normal in appearance for the degree of distention. Normal-appearing small bowel, with upper normal caliber distal jejunal loops. Marked thickening of the wall of the descending colon, sigmoid colon and rectum. Sigmoid colon diverticulosis. Mobile cecum positioned in the RIGHT UPPER QUADRANT of the abdomen. Lipoma involving the ileocecal valve. Appendix not reliably identified, but no evidence of pericecal inflammation. Vascular/Lymphatic: Moderate aortoiliofemoral atherosclerosis without evidence of aneurysm. LEFT periaortic retroperitoneal lymphadenopathy adjacent to the urothelial neoplasm, the largest node measuring approximately 1.7 x 1.0 cm. Scattered normal sized lymph nodes elsewhere in the retroperitoneum. No pathologic lymphadenopathy elsewhere. Reproductive: Normal appearing uterus.  No adnexal masses. Other: Bilobed fluid collection with an enhancing wall containing gas in the LEFT mid abdomen, adjacent to the mid descending colon, measuring approximately 9 x 7.5 x 9.5 cm. There is high attenuation contrast material within this collection and since there is no oral contrast in the adjacent bowel, I believe this is extravasated  vascular contrast from a branch of what I believe is the INFERIOR mesenteric vein. Fluid collection with enhancing wall in the dependent portion of the pelvis measuring approximately 5 x 2 x 2 cm. Musculoskeletal: No acute findings. No evidence of osseous metastatic disease. Degenerative disc disease at L4-5 and L5-S1. Bone islands in the first sacral segment. IMPRESSION: 1. Large urothelial neoplasm involving the LEFT renal pelvis and the LEFT ureter, likely along its entire length, causing marked LEFT UPPER urinary tract obstruction. Adjacent LEFT periaortic retroperitoneal metastatic lymphadenopathy. 2. Large abscess in the LEFT mid abdomen, adjacent to the mid descending  colon, with extravasated likely vascular contrast within the collection. It would appear that the contrast arises from an INFERIOR mesenteric vessel, likely a branch of the INFERIOR mesenteric vein. 3. Smaller abscess in the dependent portion of the pelvis. 4. Descending and sigmoid colitis and proctitis. Sigmoid colon diverticulosis. 5. Filling defect in the urinary bladder on delayed imaging indicating a blood clot. This is causing partial obstruction of the RIGHT urinary tract, as there is mild hydroureteronephrosis but no obstructing stone or mass. 6. Non-obstructing approximate 4 mm calculus or adjacent calculi in a LOWER pole calyx of the RIGHT kidney. 7. Geographic areas of hepatic steatosis. Indeterminate solitary approximate 1.2 cm lesion involving the LEFT lobe of liver. 8. Approximate 1 cm simple cyst arising from the proximal tail of the pancreas. This is most likely benign, given its appearance. This can be evaluated on follow-up CT examinations during her course of therapy. 9. Cholelithiasis without sonographic evidence of acute cholecystitis. 10. Small BILATERAL pleural effusions, LEFT greater than RIGHT, and associated mild passive atelectasis involving the lower lobes. Aortic Atherosclerosis (ICD10-170.0) I would telephoned  these results directly to Dr. Burlene Arnt of the emergency department at the time of interpretation on 01/29/2019 at 3:30 p.m. Electronically Signed   By: Evangeline Dakin M.D.   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 EXAM: ABSCESS INJECTION COMPARISON:  CT abdomen pelvis-earlier same day; 01/30/2019; 01/29/2019; CT-guided percutaneous drainage catheter placement x2-01/30/2019 CONTRAST:  A total of 20 cc Omnipaque 300, was administered via both percutaneous drainage catheters. FLUOROSCOPY TIME:  1 minute (20 mGy) TECHNIQUE: The patient was positioned supine on the fluoroscopy table. A preprocedural spot fluoroscopic image was obtained of the left lower abdomen and existing percutaneous drainage catheters. Multiple spot fluoroscopic and radiographic images were obtained following the injection of a small amount of contrast via the existing percutaneous drainage catheter. Images were reviewed and the decision was made to remove the more inferiorly located percutaneous catheter. As such, the external portion of the drainage catheter was cut and drainage catheters removed intact. A dressing was placed. The patient tolerated the procedure well without immediate postprocedural complication. FINDINGS: Preprocedural spot fluoroscopic image demonstrates unchanged positioning of both left-sided percutaneous drainage catheters. Fluoroscopic guided injection of the more inferiorly located percutaneous drainage catheter demonstrates opacification of decompressed abscess cavity without evidence of fistulous connection to the intestines or communication with the adjacent percutaneous drainage catheter. Given this finding, as well as resolution of the caudal component of the left-sided pericolonic abscess on preceding abdominal CT,  the decision was made to remove the percutaneous drainage catheter which was done at the patient's bedside without incident. Contrast injection of the more superiorly located previous drainage catheter demonstrates opacification of a decompressed ill-defined abscess cavity with eventual serpiginous communication to the adjacent small bowel. IMPRESSION: 1. Contrast injection of the more superior percutaneous drainage catheter demonstrates opacification of decompressed abscess cavity with fistulous connection to an adjacent loop of small bowel. 2. Contrast injection of the more inferior percutaneous drainage catheter demonstrates resolved pericolonic abscess without evidence of fistula. The more inferior percutaneous drainage catheter was removed at the patient's bedside. PLAN: - The patient and the patient's daughter were instructed to no longer flush the remaining percutaneous drainage catheter but to remain diligent records regarding drainage catheter output. - The patient will return to the interventional radiology drain  clinic in 2 weeks for repeat drainage catheter injection (no CT). The patient and the patient's daughter know to call the interventional radiology drain clinic with any interval questions or concerns. Electronically Signed   By: Sandi Mariscal M.D.   On: 02/15/2019 12:39   Ct Image Guided Drainage By Percutaneous Catheter  Result Date: 01/30/2019 INDICATION: 83 year old with a recently diagnosed urothelial neoplasm at [facility] in [place]. By report, she underwent laparoscopic surgery for nephrectomy which was aborted due to the findings. She presented to the [facility] emergency department on 01/29/2019 with CT scan of the abdomen and pelvis demonstrating an indeterminate fluid collection along the left pericolic gutter, worrisome for postoperative abscess confirmed with subsequent noncontrast CT scan performed 01/30/2019. Patient presents now for CT-guided percutaneous drainage catheter  placement for infection source control purposes. EXAM: CT IMAGE GUIDED DRAINAGE BY PERCUTANEOUS CATHETER x 2 COMPARISON:  CT abdomen pelvis-01/29/2019; 01/30/2019 MEDICATIONS: The patient is currently admitted to the hospital and receiving intravenous antibiotics. The antibiotics were administered within an appropriate time frame prior to the initiation of the procedure. ANESTHESIA/SEDATION: Moderate (conscious) sedation was employed during this procedure. A total of Versed 1 mg and Fentanyl 50 mcg was administered intravenously. Moderate Sedation Time: 26 minutes. The patient's level of consciousness and vital signs were monitored continuously by radiology nursing throughout the procedure under my direct supervision. CONTRAST:  None COMPLICATIONS: None immediate. PROCEDURE: Informed written consent was obtained from the patient after a discussion of the risks, benefits and alternatives to treatment. The patient was placed supine on the CT gantry and a pre procedural CT was performed re-demonstrating the known abscess/fluid collection within the left mid hemiabdomen measuring approximately 7.9 x 5.6 cm (image 58, series 2) and additional component within the motor lower abdomen/pelvis measuring approximately 6.0 x 2.7 cm (image 65, series 2). The procedure was planned. A timeout was performed prior to the initiation of the procedure. The skin overlying the left mid and lower abdomen was prepped and draped in the usual sterile fashion. The overlying soft tissues were anesthetized with 1% lidocaine with epinephrine. 18 gauge trocar needles were utilized to coil short Amplatz wires within both collections. Propria position was confirmed with CT imaging. Initially, the track was dilated ultimately allowing placement of a 10 [place] all-purpose drainage catheter into the dominant abscess within the left mid hemiabdomen favored to be the collection most proximal to the expected location of enteric leak adjacent to the  ascending portion of the duodenum. Following drainage catheter placement approximately 100 cc of purulent material was aspirated Repeat CT scan was performed demonstrating a persistent collection with the left lower abdomen/pelvis and as such decision was made to place an additional drainage catheter at this location. Again, the track was dilated allowing placement of a additional 10 French percutaneous drainage catheter. Next, approximately 30 cc of purulent material was aspirated. Final catheter positioning was confirmed with CT imaging and both catheters were flushed with a small amount of saline. The dominant collection adjacent to the expected location of the enteric leak was connected to a gravity bag, while the additional catheter located within the lower abdomen/pelvis was connected to a JP bulb. Dressings were applied. The patient tolerated the procedure well without immediate postprocedural complication. IMPRESSION: 1. Successful CT guided placement of a 10 [place] all purpose drain catheter into the dominant collection with the left mid hemiabdomen with aspiration of 100 cc of purulent material. This drainage catheter is felt to be located adjacent to the expected location  of enteric leak associated with the ascending portion of the duodenum and as such was connected to a gravity bag. 2. Successful CT-guided placement of a 10 French all-purpose drainage catheter into remaining abscess within the left lower abdomen/pelvis with aspiration of 100 cc of purulent material. This drainage catheter was connected to a JP bulb. PLAN: - Recommend flushing both percutaneous drainage catheters with 10 cc saline per day. - Maintain diligent records regarding output from both drainage catheters - Once the output from one or both drainage catheters has decreased the less than 10 cc per day (excluding flush), repeat CT imaging may be performed as indicated. - Recommend drainage catheter injection prior to drainage catheter  removal. Electronically Signed   By: Sandi Mariscal M.D.   On: 01/30/2019 17:51   Ct Image Guided Drainage By Percutaneous Catheter  Result Date: 01/30/2019 INDICATION: 83 year old with a recently diagnosed urothelial neoplasm at [facility] in [place]. By report, she underwent laparoscopic surgery for nephrectomy which was aborted due to the findings. She presented to the [facility] emergency department on 01/29/2019 with CT scan of the abdomen and pelvis demonstrating an indeterminate fluid collection along the left pericolic gutter, worrisome for postoperative abscess confirmed with subsequent noncontrast CT scan performed 01/30/2019. Patient presents now for CT-guided percutaneous drainage catheter placement for infection source control purposes. EXAM: CT IMAGE GUIDED DRAINAGE BY PERCUTANEOUS CATHETER x 2 COMPARISON:  CT abdomen pelvis-01/29/2019; 01/30/2019 MEDICATIONS: The patient is currently admitted to the hospital and receiving intravenous antibiotics. The antibiotics were administered within an appropriate time frame prior to the initiation of the procedure. ANESTHESIA/SEDATION: Moderate (conscious) sedation was employed during this procedure. A total of Versed 1 mg and Fentanyl 50 mcg was administered intravenously. Moderate Sedation Time: 26 minutes. The patient's level of consciousness and vital signs were monitored continuously by radiology nursing throughout the procedure under my direct supervision. CONTRAST:  None COMPLICATIONS: None immediate. PROCEDURE: Informed written consent was obtained from the patient after a discussion of the risks, benefits and alternatives to treatment. The patient was placed supine on the CT gantry and a pre procedural CT was performed re-demonstrating the known abscess/fluid collection within the left mid hemiabdomen measuring approximately 7.9 x 5.6 cm (image 58, series 2) and additional component within the motor lower abdomen/pelvis measuring approximately 6.0 x  2.7 cm (image 65, series 2). The procedure was planned. A timeout was performed prior to the initiation of the procedure. The skin overlying the left mid and lower abdomen was prepped and draped in the usual sterile fashion. The overlying soft tissues were anesthetized with 1% lidocaine with epinephrine. 18 gauge trocar needles were utilized to coil short Amplatz wires within both collections. Propria position was confirmed with CT imaging. Initially, the track was dilated ultimately allowing placement of a 10 [place] all-purpose drainage catheter into the dominant abscess within the left mid hemiabdomen favored to be the collection most proximal to the expected location of enteric leak adjacent to the ascending portion of the duodenum. Following drainage catheter placement approximately 100 cc of purulent material was aspirated Repeat CT scan was performed demonstrating a persistent collection with the left lower abdomen/pelvis and as such decision was made to place an additional drainage catheter at this location. Again, the track was dilated allowing placement of a additional 10 French percutaneous drainage catheter. Next, approximately 30 cc of purulent material was aspirated. Final catheter positioning was confirmed with CT imaging and both catheters were flushed with a small amount of saline. The dominant  collection adjacent to the expected location of the enteric leak was connected to a gravity bag, while the additional catheter located within the lower abdomen/pelvis was connected to a JP bulb. Dressings were applied. The patient tolerated the procedure well without immediate postprocedural complication. IMPRESSION: 1. Successful CT guided placement of a 10 [place] all purpose drain catheter into the dominant collection with the left mid hemiabdomen with aspiration of 100 cc of purulent material. This drainage catheter is felt to be located adjacent to the expected location of enteric leak associated with the  ascending portion of the duodenum and as such was connected to a gravity bag. 2. Successful CT-guided placement of a 10 French all-purpose drainage catheter into remaining abscess within the left lower abdomen/pelvis with aspiration of 100 cc of purulent material. This drainage catheter was connected to a JP bulb. PLAN: - Recommend flushing both percutaneous drainage catheters with 10 cc saline per day. - Maintain diligent records regarding output from both drainage catheters - Once the output from one or both drainage catheters has decreased the less than 10 cc per day (excluding flush), repeat CT imaging may be performed as indicated. - Recommend drainage catheter injection prior to drainage catheter removal. Electronically Signed   By: Sandi Mariscal M.D.   On: 01/30/2019 17:51   Ir Radiologist Eval & Mgmt  Result Date: 02/15/2019 Please refer to notes tab for details about interventional procedure. (Op Note)   PERFORMANCE STATUS (ECOG) : 3 - Symptomatic, >50% confined to bed  REVIEW OF SYSTEMS: As noted above. Otherwise, a complete review of systems is negative.  PHYSICAL EXAM: General: NAD, frail appearing, thin Cardiovascular: regular rate and rhythm Pulmonary: clear ant fields Abdomen: soft, nontender, hypoactive bowel sounds, drain noted but not visualized Extremities: no edema Skin: no rashes Neurological: Weakness but otherwise nonfocal  IMPRESSION: Patient was an add-on to my clinic schedule today per daughter's request.  I met with daughter and patient while she was receiving IV fluids.  I introduced palliative care services and attempted to establish therapeutic rapport.  Patient and family describe fairly rapid decline in functional status over the past months.  She is now living at home with her daughter who is her primary caregiver.  Patient is ambulatory with use of a walker but requires her daughter to help with ambulation and transfers.  Daughter is also helping with bathing  and dressing.  Will order home health PT and OT for evaluation and treatment of weakness.  Patient describes feeling discomfort at times but struggles to characterize and is not sure that it is "pain".  She has Percocet in the home, which was previously ordered and she plans to try it to see if it alleviates her symptoms.  She is not sleeping well at night and then takes multiple naps throughout the day.  We discussed importance of sleep hygiene and I recommended that she tries to limit her daytime napping.  Will start mirtazapine 7.5 mg nightly as this might help sleep and appetite.  Appetite is quite poor.  Patient eating sips and bites.  She was prescribed Megace by Dr. Tasia Catchings but has not yet started that.  She has been seen by Desmond Lope, RD.  She is trying oral supplements but often cannot finish 1 Ensure in an entire day.  We discussed trying to maximize calories and protein with smaller, more frequent meals throughout the day.  Patient has been talking with her daughter recently about death.  She says she does not fear death  and has a strong faith in God.  Her goals seem aligned with comfort and quality of life even if it means a shorter life expectancy.  Both patient and daughter are unsure that she will become strong enough to tolerate treatment.  We discussed the possible future involvement of hospice if treatment is felt not to be a viable option.  Patient does not have advanced directives but would be interested in establishing those while here at the cancer center.  She would like to appoint her daughter to be her 76.  She would also like to establish a living will.  We will ask the chaplain to assist with completing these documents.  We discussed CODE STATUS at length.  Patient says she would not want to be resuscitated or have her life prolonged artificially on machines.  She would be in agreement with short-term hospitalization if it were felt that she had a condition that was treatable.      I completed a MOST form today. The patient and family outlined their wishes for the following treatment decisions:  Cardiopulmonary Resuscitation: Do Not Attempt Resuscitation (DNR/No CPR)  Medical Interventions: Limited Additional Interventions: Use medical treatment, IV fluids and cardiac monitoring as indicated, DO NOT USE intubation or mechanical ventilation. May consider use of less invasive airway support such as BiPAP or CPAP. Also provide comfort measures. Transfer to the hospital if indicated. Avoid intensive care.   Antibiotics: Antibiotics if indicated  IV Fluids: IV fluids if indicated  Feeding Tube: No feeding tube    PLAN: -Supportive care -Percocet prn for pain -Prophylactic bowel regimen -Start mirtazapine 7.42m nightly -Continue oral supplements -Home health PT/OT -DNR -MOST form completed -Chaplain to assist with completing ACP -RTC next week   Patient expressed understanding and was in agreement with this plan. She also understands that She can call clinic at any time with any questions, concerns, or complaints.     Time Total: 60 minutes  Visit consisted of counseling and education dealing with the complex and emotionally intense issues of symptom management and palliative care in the setting of serious and potentially life-threatening illness.Greater than 50%  of this time was spent counseling and coordinating care related to the above assessment and plan.  Signed by: JAltha Harm PhD, NP-C [phone](Work Cell)

## 2019-02-16 NOTE — Telephone Encounter (Signed)
I discussed in details with one of her daughter who accompanied patient earlier this week.  Is patient ok with releasing information to this daughter? Can one daughter relay information to the other? Can you ask patient to provide one appointed family member for [place] to discuss information with?  Please let the other daughter know that she is welcome to come with her mother's appointment next week.

## 2019-02-16 NOTE — Telephone Encounter (Signed)
Patient other daughter who was unable to come to her appointment wopuld appreciate a return call from doc to explain what all is going on with her mother.Please return her call [phone].

## 2019-02-17 NOTE — Telephone Encounter (Signed)
This daughter is on her release of information consent

## 2019-02-18 ENCOUNTER — Inpatient Hospital Stay (HOSPITAL_BASED_OUTPATIENT_CLINIC_OR_DEPARTMENT_OTHER): Payer: Medicare HMO | Admitting: Hospice and Palliative Medicine

## 2019-02-18 ENCOUNTER — Encounter: Payer: Self-pay | Admitting: Urology

## 2019-02-18 ENCOUNTER — Encounter: Payer: Self-pay | Admitting: Oncology

## 2019-02-18 ENCOUNTER — Inpatient Hospital Stay: Payer: Medicare HMO

## 2019-02-18 ENCOUNTER — Other Ambulatory Visit: Payer: Self-pay

## 2019-02-18 ENCOUNTER — Inpatient Hospital Stay (HOSPITAL_BASED_OUTPATIENT_CLINIC_OR_DEPARTMENT_OTHER): Payer: Medicare HMO | Admitting: Oncology

## 2019-02-18 ENCOUNTER — Other Ambulatory Visit: Payer: Self-pay | Admitting: *Deleted

## 2019-02-18 ENCOUNTER — Encounter: Payer: Self-pay | Admitting: Hospice and Palliative Medicine

## 2019-02-18 ENCOUNTER — Telehealth: Payer: Self-pay | Admitting: *Deleted

## 2019-02-18 ENCOUNTER — Other Ambulatory Visit: Payer: Medicare HMO

## 2019-02-18 VITALS — BP 105/64 | HR 88 | Temp 97.5°F | Resp 18 | Wt 117.2 lb

## 2019-02-18 DIAGNOSIS — Z7189 Other specified counseling: Secondary | ICD-10-CM

## 2019-02-18 DIAGNOSIS — Z853 Personal history of malignant neoplasm of breast: Secondary | ICD-10-CM

## 2019-02-18 DIAGNOSIS — Z515 Encounter for palliative care: Secondary | ICD-10-CM

## 2019-02-18 DIAGNOSIS — E860 Dehydration: Secondary | ICD-10-CM | POA: Diagnosis not present

## 2019-02-18 DIAGNOSIS — K651 Peritoneal abscess: Secondary | ICD-10-CM | POA: Diagnosis not present

## 2019-02-18 DIAGNOSIS — E440 Moderate protein-calorie malnutrition: Secondary | ICD-10-CM

## 2019-02-18 DIAGNOSIS — C689 Malignant neoplasm of urinary organ, unspecified: Secondary | ICD-10-CM | POA: Diagnosis not present

## 2019-02-18 DIAGNOSIS — R634 Abnormal weight loss: Secondary | ICD-10-CM

## 2019-02-18 DIAGNOSIS — D649 Anemia, unspecified: Secondary | ICD-10-CM | POA: Diagnosis not present

## 2019-02-18 DIAGNOSIS — Z87891 Personal history of nicotine dependence: Secondary | ICD-10-CM

## 2019-02-18 MED ORDER — SODIUM CHLORIDE 0.9 % IV SOLN
Freq: Once | INTRAVENOUS | Status: AC
  Administered 2019-02-18: 10:00:00 via INTRAVENOUS
  Filled 2019-02-18: qty 250

## 2019-02-18 NOTE — Progress Notes (Signed)
Hematology/Oncology Follow Up Note [facility]  Telephone:([phone] [phone]  Patient Care Team: Arnetha Courser, MD as PCP - General (Family Medicine)   Name of the patient: Tonya Crawford  [id number]  Apr 22, 1936   REASON FOR VISIT Goal of care discussion for clinical metastatic urothelia carcinoma/   PERTINENT ONCOLOGY HISTORY Tonya Crawford is a 83 y.o.afemale who has above oncology history reviewed by me today presented for follow up visit for management of urothelia cancer.  Patient was recently admitted from 01/29/2019-02/03/2019.  She was seen by my colleague Dr. Mike Gip.  Patient prefers to stay at [facility] for her care.  Establish care with me on 02/14/2019. I reviewed patient's past medical history, images and pathology results. #Patient had abdominal pelvis CT at [facility] ER on 07/13/2018 reviewed moderate dilation of left collecting system and proximal ureter with hyperdensity narrowing of mid ureter.  Stone versus soft tissue mass.  She was followed up by Dr. Lewie Loron urology.  Stent was placed in August 2019 and removed 4 weeks later.  Lasix renal scan on 09/14/2018 reviewed and minimally functional left kidney.  Patient had CT chest abdomen pelvis on 01/05/2019 which revealed a large enlarging solid mass involving the left renal pelvis and the left ureter consistent with urothelial malignancy.  There was obstruction of the left kidney and severe hydronephrosis.  There was contact with the left psoas muscle and questionable muscle involvement.  2 left perirenal lymph nodes are suspicious for metastasis.  There was concern for portal vein thrombosis involving the anterior branch of the right portal vein.  7 mm lesion of the right hepatic lobe and a 1 cm pancreatic tail cyst. Patient was admitted to [facility] from 01/13/2019- 01/15/2019.  She was scheduled to have a laparoscopic left nephrectomy by Dr. Darcella Gasman on 01/13/2019.  The  procedure was aborted as there was hard tissue around the entire path of the ureter.  Attempt to get through this between aorta and the kidney were not feasible due to induration.  The renal vein could not be safely seen due to encapsulation and reactive tissue throughout.  Biopsy of the lymph node revealed invasive high-grade urothelial carcinoma within the fibroadipose tissue. Labs at [facility] on 01/14/2019 included a CA27.29 of 17.7 (0-38.6), CA19-9 of 12 (0-35), and LDH of 215 (140-271).  CBC on 01/15/2019 revealed a hematocrit of 37, hemoglobin 12.2, MCV 87, platelets 181,000, and WBC 4200.  Creatinine was 1.32  Patient declined after discharged from [facility].  She was seen in the [facility] ER on 01/29/2019 with increasing abdominal pain and fatigue. A repeat abdomen/pelvis CT on 01/29/2019 showed large urothelial neoplasm involving left renal pelvis and the left ureter.  Likely along its entire length.  Causing market left upper urinary tract obstruction.  Adjacent left periaortic retroperitoneal metastatic lymphadenopathy. Large abscess in the left mid abdomen, adjacent to the mid descending colon, with extravasated likely vascular contrast within the collection.  It would appear that the contrast arise from inferior mesenteric vessel likely branch of the inferior mesenteric Tarik vein. Small abscess in pelvis.  Descending and sigmoid colitis and proctitis.  Sigmoid diverticulosis. Filling defect in the urinary bladder on delayed imaging indicating a blood clot.  This is causing partial obstruction of the right urinary tract. Nonobstructing 4 mm calculus or adjacent calculi in the lower pole calyx of right kidney. Geographic area of hepatic steatosis.  Indeterminate solitary approximate 1.2 cm lesion involving the left lobe of  liver. 1 cm simple cyst arising from proximal tail of the pancreas. Cholelithiasis without sonographic evidence of acute cholecystitis.  #Patient underwent CT-guided drainage by  percutaneous catheter placement on 01/30/2019.  Her culture reviewed abundant gram-positive cocci in pairs and clusters.  Abundant gram-negative rods, moderate gram-positive rods. Multiple organisms present, none predominant.  No anaerobic isolated. Patient was discharged on 02/03/2019 with a course of Augmentin 875/125 mg twice daily for 5 days.  She has finished a course.  # She has a history of stage IIIA right breast cancer s/p lumpectomy on 12/12/2003 at [facility].  Pathology revealed a 2.5 x 1.8 x 1.6 cm grade III invasive ductal carcinoma with focal component of of non-comedo intraductal carcinoma.  Margins were negative.  There were 11 of 20 lymph nodes positive.  The largest lymph node was 2.2 cm.  Tumor was ER+, PR+, and Her2/neu 2+.  Pathologic stage was T2N3Mx.  She received chemotherapy (unknown type), radiation, and 5 years of tamoxifen.   CA27.29 was 17.7 on 01/14/2019.  INTERVAL HISTORY Patient was accompanied by daughter Tonya Crawford for goals of care discussion. Patient's other daughter Tonya Crawford called 2 days ago and request update.  I had a lengthy discussion with Tonya Crawford and updated her. We received another call from Tonya Crawford that patient request another discussion for her goals of care and management plan.  Patient has got IV fluid hydration sessions 2 times this week.  Reports feeling better after each IV fluid session.  However does not last long.  In the morning she still feel very fatigued. Appetite has been improved after being started on Megace.. Patient has gained 5 pounds since last visit.  #During the interval patient has also had CT abdomen pelvis with contrast which showed interval reduction/near resolution of left-sided peri-colonic complex fluid collection.  Small fluid collection persist with the left upper quadrant.  Ill-defined fluid collection with the pelvic cul-de-sac has resolved in the interval. Similar appearance of known infiltrative mass involving the left renal collecting  system superior aspect the left ureter with associated left-sided retroperitoneal lymphadenopathy. Increased conspicuity of suspected advanced hepatic metastasis disease, potentially artifactual due to the phase of enhancement on today's exam.  No worrisome.  Ill-defined lytic lesion within the pelvis and the lumbar spine, potentially area osteopenia/osteoporosis though metastatic disease could have a similar appearance.  She denies any pain today.  She takes Tylenol occasionally.  She also has Percocet which she is not taking.  Review of Systems  Constitutional: Positive for appetite change, fatigue and unexpected weight change. Negative for chills and fever.  HENT:   Negative for hearing loss and voice change.   Eyes: Negative for eye problems.  Respiratory: Negative for chest tightness and cough.   Cardiovascular: Negative for chest pain.  Gastrointestinal: Negative for abdominal distention, abdominal pain and blood in stool.  Endocrine: Negative for hot flashes.  Genitourinary: Negative for difficulty urinating and frequency.   Musculoskeletal: Negative for arthralgias.  Skin: Negative for itching and rash.  Neurological: Positive for extremity weakness.  Hematological: Negative for adenopathy.  Psychiatric/Behavioral: Negative for confusion.      No Known Allergies   Past Medical History:  Diagnosis Date  . Cancer [facility])    Breast Cancer  . Cancer ([place])    Urothilial Cancer  . Hypertension   . Thyroid disease      Past Surgical History:  Procedure Laterality Date  . BREAST LUMPECTOMY  12/12/2003  . IR RADIOLOGIST EVAL & MGMT  02/15/2019  . percutaneous drainage  of abdomen abcess  01/30/2019    Social History   Socioeconomic History  . Marital status: Divorced    Spouse name: Not on file  . Number of children: Not on file  . Years of education: Not on file  . Highest education level: Not on file  Occupational History  . Not on file  Social Needs  . Financial  resource strain: Not on file  . Food insecurity:    Worry: Not on file    Inability: Not on file  . Transportation needs:    Medical: Not on file    Non-medical: Not on file  Tobacco Use  . Smoking status: Former Smoker    Types: Cigarettes  . Smokeless tobacco: Never Used  Substance and Sexual Activity  . Alcohol use: Yes    Comment: occasionally  . Drug use: Not on file  . Sexual activity: Not on file  Lifestyle  . Physical activity:    Days per week: Not on file    Minutes per session: Not on file  . Stress: Not on file  Relationships  . Social connections:    Talks on phone: Not on file    Gets together: Not on file    Attends religious service: Not on file    Active member of club or organization: Not on file    Attends meetings of clubs or organizations: Not on file    Relationship status: Not on file  . Intimate partner violence:    Fear of current or ex partner: Not on file    Emotionally abused: Not on file    Physically abused: Not on file    Forced sexual activity: Not on file  Other Topics Concern  . Not on file  Social History Narrative  . Not on file     Family History  Problem Relation Age of Onset  . Lung cancer Sister   . Adrenal disorder Sister    Report one of her sisters had lung cancer, and another sister had adrenal tumor.    Current Outpatient Medications:  .  alum & mag hydroxide-simeth (MAALOX/MYLANTA) 200-200-20 MG/5ML suspension, Take 30 mLs by mouth every 4 (four) hours as needed for indigestion or heartburn., Disp: 355 mL, Rfl: 0 .  feeding supplement, ENSURE ENLIVE, (ENSURE ENLIVE) LIQD, Take 237 mLs by mouth 2 (two) times daily between meals., Disp: 237 mL, Rfl: 12 .  ibuprofen (ADVIL,MOTRIN) 200 MG tablet, Take 200 mg by mouth every 6 (six) hours as needed., Disp: , Rfl:  .  levothyroxine (SYNTHROID, LEVOTHROID) 88 MCG tablet, Take 88 mcg by mouth daily., Disp: , Rfl:  .  lisinopril (PRINIVIL,ZESTRIL) 5 MG tablet, Take 5 mg by mouth  daily., Disp: , Rfl:  .  megestrol (MEGACE) 40 MG tablet, Take 1 tablet (40 mg total) by mouth 2 (two) times daily., Disp: 60 tablet, Rfl: 0 .  multivitamin-lutein (OCUVITE-LUTEIN) CAPS capsule, Take 1 capsule by mouth daily for 30 days., Disp: 30 capsule, Rfl: 0 .  oxyCODONE-acetaminophen (PERCOCET/ROXICET) 5-325 MG tablet, Take 1 tablet by mouth every 6 (six) hours as needed for pain., Disp: , Rfl:  .  polyethylene glycol (MIRALAX) packet, Take 17 g by mouth daily., Disp: 14 each, Rfl: 0 .  mirtazapine (REMERON) 7.5 MG tablet, Take 1 tablet (7.5 mg total) by mouth at bedtime. (Patient not taking: Reported on 02/18/2019), Disp: 30 tablet, Rfl: 2  Physical exam: ECOG 3 Vitals:   02/18/19 0851  BP: 105/64  Pulse: 88  Resp: 18  Temp: (!) 97.5 F (36.4 C)  TempSrc: Tympanic  SpO2: 98%  Weight: 117 lb 3.2 oz (53.2 kg)   Physical Exam Constitutional:      General: She is not in acute distress.    Appearance: She is ill-appearing.  HENT:     Head: Normocephalic and atraumatic.  Eyes:     General: No scleral icterus.    Pupils: Pupils are equal, round, and reactive to light.  Neck:     Musculoskeletal: Normal range of motion and neck supple.  Cardiovascular:     Rate and Rhythm: Normal rate and regular rhythm.     Heart sounds: Normal heart sounds.  Pulmonary:     Effort: Pulmonary effort is normal. No respiratory distress.     Breath sounds: No wheezing.  Abdominal:     General: Bowel sounds are normal. There is no distension.     Palpations: Abdomen is soft. There is no mass.     Tenderness: There is no abdominal tenderness.  Musculoskeletal: Normal range of motion.        General: No deformity.  Skin:    General: Skin is warm and dry.     Findings: No erythema or rash.  Neurological:     Mental Status: She is alert and oriented to person, place, and time.     Cranial Nerves: No cranial nerve deficit.     Coordination: Coordination normal.  Psychiatric:        Behavior:  Behavior normal.        Thought Content: Thought content normal.     CMP Latest Ref Rng & Units 02/14/2019  Glucose 70 - 99 mg/dL 87  BUN 8 - 23 mg/dL 22  Creatinine 0.44 - 1.00 mg/dL 1.02(H)  Sodium 135 - 145 mmol/L 132(L)  Potassium 3.5 - 5.1 mmol/L 4.7  Chloride 98 - 111 mmol/L 99  CO2 22 - 32 mmol/L 25  Calcium 8.9 - 10.3 mg/dL 8.2(L)  Total Protein 6.5 - 8.1 g/dL 7.2  Total Bilirubin 0.3 - 1.2 mg/dL 1.1  Alkaline Phos 38 - 126 U/L 495(H)  AST 15 - 41 U/L 42(H)  ALT 0 - 44 U/L 33   CBC Latest Ref Rng & Units 02/14/2019  WBC 4.0 - 10.5 K/uL 10.7(H)  Hemoglobin 12.0 - 15.0 g/dL 9.7(L)  Hematocrit 36.0 - 46.0 % 31.5(L)  Platelets 150 - 400 K/uL 246  RADIOGRAPHIC STUDIES: I have personally reviewed the radiological images as listed and agreed with the findings in the report. Ct Abdomen Pelvis Wo Contrast  Result Date: 01/30/2019 CLINICAL DATA:  83 year old with a recently diagnosed urothelial neoplasm at [facility] in [place]. By report, she underwent laparoscopic surgery for nephrectomy which was aborted due to the findings. She presents now with worsening generalized abdominal pain. She has leukocytosis, elevated lipase, elevated liver function tests, hypoalbuminemia and hypocalcemia on laboratory evaluation. CT scan of the abdomen and pelvis performed 01/29/2019 demonstrates an indeterminate fluid collection along the left pericolic gutter with concern for either vascular or enteric contrast extravasation. Please perform noncontrast abdominal CT for further evaluation of this indeterminate fluid collection. EXAM: CT ABDOMEN AND PELVIS WITHOUT CONTRAST TECHNIQUE: Multidetector CT imaging of the abdomen and pelvis was performed following the standard protocol without IV contrast. COMPARISON:  CT abdomen pelvis-01/29/2019 FINDINGS: Lower chest: Limited visualization the lower thorax demonstrates trace bilateral effusions with associated bibasilar heterogeneous opacities, left greater  than right. No discrete focal airspace opacities. Normal heart size. Trace pericardial effusion.  There is diffuse decreased attenuation of the intra cardiac blood pool suggestive anemia. Coronary artery calcifications. Hepatobiliary: Known ill-defined hepatic lesions are not well evaluated on present noncontrast examination. Trace amount of perihepatic ascites unchanged. Suspected cholelithiasis. Otherwise, normal noncontrast appearance the gallbladder. Pancreas: Unchanged appearance of the approximately 1.0 cm hypoattenuating lesion within the mid body of the pancreas (image 27, series 2, incompletely characterized on present examination though potentially representative of a pancreatic cyst/IPMN is not found to demonstrate enhancement contrast-enhanced examination performed day prior. Spleen: Normal noncontrast appearance of the spleen. Adrenals/Urinary Tract: Redemonstrated infiltrative mass involving the left renal pelvis and proximal ureter with associated marked left-sided pelvicaliectasis. Excreted contrast from examination performed day prior remains within the right renal collecting system. No evidence of right-sided urinary obstruction. Minimal amount bilateral perinephric stranding. There is unchanged mild thickening the left mid gland without discrete nodule. Normal noncontrast appearance of the right adrenal gland. Enteric contrast is seen within the urinary bladder. Grossly unchanged appearance of previously identified loculated mixed air and fluid containing collection within the left pericolic gutter with dominant more anteriorly located collection measuring approximately 8.- x 5.9 x 8.9 cm (axial image 45, series 2; 36, series 5) and caudal component measuring approximately 5.5 x 2.6 x 6.8 cm (axial image 51 series 2; 47, series 5), unchanged, previously, 7.8 x 7.7 x 8.6 cm and 5.8 x 2.8 x 6.6 cm respectively. Previously noted high-density material within the collection has dissipated in the  interval. Stomach/Bowel: Ingested enteric contrast is now seen extending to the level of the rectum. Grossly unchanged scattered foci of subcutaneous emphysema. No pneumatosis or portal venous gas. Vascular/Lymphatic: Atherosclerotic plaque within a tortuous but normal caliber abdominal aorta. Reproductive: Normal noncontrast appearance of the pelvic organs. Re-demonstrated approximately 5.3 x 2.2 cm fluid collection with the pelvic cul-de-sac (image 73, series 2). Other: Redemonstrated subcutaneous emphysema about the body wall, presumably the residua of recent attempted laparoscopic intervention. Musculoskeletal: No acute or aggressive osseous abnormalities. Moderate DDD of L5-S1 and to a lesser extent, L4-L5 with disc space height loss, endplate irregularity small posteriorly directed disc osteophyte complexes at these locations. IMPRESSION: 1. Unchanged loculated mixed air and fluid containing collection within the left pericolic gutter with dissipated of previously noted high density material. Given lack of significant increase in size of the collection as well as proximity of the medial aspect of the collection to the ascending portion of the duodenum on preceding examination, the high-density material within the collection is favored to represented enteric contrast. 2. Unchanged appearance of approximately 5.3 x 2.3 cm collection within the pelvic cul-de-sac 3. Unchanged appearance of known infiltrative left-sided renal mass with associated marked upstream pelvicaliectasis. 4. Grossly unchanged suspected hepatic metastasis 5. Additional ancillary findings as above. 6. Aortic Atherosclerosis (ICD10-I70.0). PLAN: - The patient is to undergo CT-guided aspiration/drainage of loculated left-sided pericolic collections for diagnostic and therapeutic purposes. - Appropriateness of proceeding with left-sided percutaneous nephrostomy placement was discussed with on-call urologist, Dr. Junious Silk, and the decision was  made NOT to proceed with image guided nephrostomy catheter placement at this time given concern for track seeding of the malignancy. Electronically Signed   By: Sandi Mariscal M.D.   On: 01/30/2019 10:38   Ct Abdomen Pelvis W Contrast  Result Date: 02/15/2019 CLINICAL DATA:  Recently diagnosed left-sided urethral neoplasm who underwent attempted laparoscopic nephrectomy at an outside institution, however the procedure was aborted due to findings of advanced disease at the time of the operation. Patient subsequent presented to the [facility]  emergency department on 01/29/2019 with abdominal pain with CT of the abdomen and pelvis demonstrating findings worrisome for development of a pericolonic abscess for which she underwent CT-guided placement of 2 percutaneous drainage catheters on 01/30/2019. Patient presents today to the interventional radiology drain Clinic for drainage catheter evaluation and management. EXAM: CT ABDOMEN AND PELVIS WITH CONTRAST TECHNIQUE: Multidetector CT imaging of the abdomen and pelvis was performed using the standard protocol following bolus administration of intravenous contrast. CONTRAST:  150m ISOVUE-300 IOPAMIDOL (ISOVUE-300) INJECTION 61% COMPARISON:  CT abdomen pelvis-01/30/2019; 01/29/2019; CT-guided percutaneous drainage catheter placement x2-01/29/2019 FINDINGS: Lower chest: Limited visualization of the lower thorax demonstrates minimal dependent subpleural ground-glass atelectasis. No discrete focal airspace opacities. No pleural effusion. Normal heart size.  No pericardial effusion. Hepatobiliary: Previously question ill-defined lesion within the subcapsular aspect the left lobe of the liver appears more conspicuous on the present examination measuring approximately 2.6 x 2.1 cm (image 20, series 2). There is marked mottled perfusion involving the posterior segment of the right lobe of the liver (image 11, series 2) with multiple apparent ill-defined hypoattenuating hepatic  lesions with dominant lesion within the subcapsular aspect of the right lobe of the liver measuring approximately 1.8 x 1.5 cm (coronal image 60, series 6) and additional apparent lesion within the dome of the right lobe of the liver measuring approximately 1.4 x 1.1 cm (coronal image 65, series 6). No intrahepatic biliary duct dilatation. No ascites. Normal appearance of the gallbladder given degree distention. No radiopaque gallstones. No ascites. Pancreas: Redemonstrated approximately 0.9 cm hypoattenuating (13 Hounsfield unit) presumed pancreatic cyst. No definitive pancreatic ductal dilatation. Spleen: Normal appearance of the spleen. Adrenals/Urinary Tract: Grossly unchanged infiltrative mass involving the left renal pelvis extending to involve the superior aspect the left ureter (representative images 35 and 43, series 2). These findings are again associated with marked upstream pelvicaliectasis and mild asymmetric right-sided perinephric stranding. Normal appearance of the right kidney. Note is again made of a punctate (approximately 0.4 cm) nonobstructing right-sided renal stone. Otherwise, normal appearance of the right kidney. No right-sided urinary obstruction. Normal appearance the bilateral adrenal glands. Stomach/Bowel: Stable positioning of left-sided pericolonic percutaneous drainage catheters with reduction of pericolonic mixed air and fluid collections. Serpiginous collection remains within the left upper abdominal quadrant interposed between the tail of the pancreas, the spleen and kidney with dominant component measuring approximately 5.0 x 2.1 x 2.8 cm (axial image 24, series 2; coronal image 64, series 6). Ill-defined fluid collection within the pelvic cul-de-sac as resolved in the interval. No definitive new definable/drainable fluid collections. Moderate to large colonic stool burden without evidence of enteric obstruction. Normal appearance of the terminal ileum. The appendix is not  visualized, however there is no pericecal inflammatory change. No pneumoperitoneum, pneumatosis or portal venous gas. Vascular/Lymphatic: Atherosclerotic plaque within a normal caliber abdominal aorta. The major branch vessels of the abdominal aorta appear patent on this non CTA examination. Unchanged scattered retroperitoneal adenopathy with index left-sided periaortic lymph node measuring 1.3 cm in greatest short axis diameter (image 36, series 2 unchanged compared to the 01/29/19 20 examination. Reproductive: Normal appearance of the pelvic organs. No free fluid the pelvic cul-de-sac. Other: Regional soft tissues appear normal. Musculoskeletal: Ill-defined lytic lesions are seen within the pelvis (image 56, series 2 as well as scattered within the lumbar spine with index approximately 1 cm lesion within the T12 vertebral body (sagittal image 75, series 8) and additional smaller lesions seen within the L4 and L5 vertebral bodies (image 76, series 8).  IMPRESSION: 1. Interval reduction/near resolution left-sided pericolonic complex fluid collections following percutaneous drainage catheter placement. Small (approximately 5 cm) serpiginous fluid collection persists within the left upper abdominal quadrant. 2. Ill-defined fluid collection with the pelvic cul-de-sac has resolved in the interval. No new definable/drainable fluid collections. 3. Similar appearance of known infiltrative mass involving the left renal collecting system superior aspect the left ureter with associated adjacent left-sided retroperitoneal lymphadenopathy. 4. Increased conspicuity of suspected advanced hepatic metastatic disease, potentially artifactual due to phase of enhancement on today's examination, though worrisome. Further evaluation with abdominal MRI could be performed as clinically indicated. 5. Ill-defined lytic lesions within the pelvis and lumbar spine, potentially areas osteopenia/osteoporosis though metastatic disease could have a  similar appearance. Further evaluation with lumbar spine MRI could be performed as indicated. PLAN: Patient subsequently underwent fluoroscopic guided injection of both percutaneous drainage catheters. Electronically Signed   By: Sandi Mariscal M.D.   On: 02/15/2019 12:32   Ct Abdomen Pelvis W Contrast  Result Date: 01/29/2019 CLINICAL DATA:  83 year old with a recently diagnosed urothelial neoplasm at [facility] in [place]. By report, she underwent laparoscopic surgery for nephrectomy which was aborted due to the findings. She presents now with worsening generalized abdominal pain. She has leukocytosis, elevated lipase, elevated liver function tests, hypoalbuminemia and hypocalcemia on laboratory evaluation. EXAM: CT ABDOMEN AND PELVIS WITH CONTRAST TECHNIQUE: Multidetector CT imaging of the abdomen and pelvis was performed using the standard protocol following bolus administration of intravenous contrast. Delayed imaging through the abdomen and pelvis was also performed. CONTRAST:  77m OMNIPAQUE IOHEXOL 300 MG/ML IV. Oral contrast was also administered. COMPARISON:  No outside imaging is currently available for comparison. FINDINGS: Lower chest: Small BILATERAL pleural effusions, LEFT greater than RIGHT, with associated passive atelectasis in the lower lobes. Visualized lung bases otherwise clear. Heart size upper normal. No pericardial effusion. Hepatobiliary: Geographic low attenuation scattered throughout the liver. Approximate 1.3 cm mass involving the LEFT lobe (series 2, image 20). No other discrete hepatic masses. Small gallstones in the gallbladder without CT evidence of acute cholecystitis. No biliary ductal dilation. Pancreas: Approximate 1.1 x 1.0 cm well-circumscribed simple cyst arising from the proximal tail of the pancreas. No other pancreatic masses. No peripancreatic edema/inflammation. Spleen: Normal in size and appearance. Small focus of accessory splenic tissue ANTERIOR to the spleen at  the hilum. Adrenals/Urinary Tract: Normal appearing adrenal glands. Large mass with its epicenter in the LEFT renal pelvis extending along the length of the proximal and mid ureter in the LEFT retroperitoneum over a length of at least 15 cm, and perhaps involving the entire ureter. The mass causes marked hydronephrosis involving the LEFT kidney with significant obstruction, as there is delayed contrast excretion by the LEFT kidney and no contrast in the collecting system on the delayed images. Mild RIGHT hydroureteronephrosis, though there is no obstructing stone or mass in the RIGHT ureter, and the ureter can be followed to the bladder on the delayed images. Adjacent non-obstructing RIGHT LOWER pole renal calculi, the largest measuring approximately 4 mm. RIGHT kidney otherwise normal in appearance. Filling defect dependently in the urinary bladder on the delayed images is likely blood clot; this clot is adjacent to the RIGHT ureteral orifice and may account for the mild hydroureteronephrosis. Stomach/Bowel: Stomach normal in appearance for the degree of distention. Normal-appearing small bowel, with upper normal caliber distal jejunal loops. Marked thickening of the wall of the descending colon, sigmoid colon and rectum. Sigmoid colon diverticulosis. Mobile cecum positioned in the RIGHT  UPPER QUADRANT of the abdomen. Lipoma involving the ileocecal valve. Appendix not reliably identified, but no evidence of pericecal inflammation. Vascular/Lymphatic: Moderate aortoiliofemoral atherosclerosis without evidence of aneurysm. LEFT periaortic retroperitoneal lymphadenopathy adjacent to the urothelial neoplasm, the largest node measuring approximately 1.7 x 1.0 cm. Scattered normal sized lymph nodes elsewhere in the retroperitoneum. No pathologic lymphadenopathy elsewhere. Reproductive: Normal appearing uterus.  No adnexal masses. Other: Bilobed fluid collection with an enhancing wall containing gas in the LEFT mid  abdomen, adjacent to the mid descending colon, measuring approximately 9 x 7.5 x 9.5 cm. There is high attenuation contrast material within this collection and since there is no oral contrast in the adjacent bowel, I believe this is extravasated vascular contrast from a branch of what I believe is the INFERIOR mesenteric vein. Fluid collection with enhancing wall in the dependent portion of the pelvis measuring approximately 5 x 2 x 2 cm. Musculoskeletal: No acute findings. No evidence of osseous metastatic disease. Degenerative disc disease at L4-5 and L5-S1. Bone islands in the first sacral segment. IMPRESSION: 1. Large urothelial neoplasm involving the LEFT renal pelvis and the LEFT ureter, likely along its entire length, causing marked LEFT UPPER urinary tract obstruction. Adjacent LEFT periaortic retroperitoneal metastatic lymphadenopathy. 2. Large abscess in the LEFT mid abdomen, adjacent to the mid descending colon, with extravasated likely vascular contrast within the collection. It would appear that the contrast arises from an INFERIOR mesenteric vessel, likely a branch of the INFERIOR mesenteric vein. 3. Smaller abscess in the dependent portion of the pelvis. 4. Descending and sigmoid colitis and proctitis. Sigmoid colon diverticulosis. 5. Filling defect in the urinary bladder on delayed imaging indicating a blood clot. This is causing partial obstruction of the RIGHT urinary tract, as there is mild hydroureteronephrosis but no obstructing stone or mass. 6. Non-obstructing approximate 4 mm calculus or adjacent calculi in a LOWER pole calyx of the RIGHT kidney. 7. Geographic areas of hepatic steatosis. Indeterminate solitary approximate 1.2 cm lesion involving the LEFT lobe of liver. 8. Approximate 1 cm simple cyst arising from the proximal tail of the pancreas. This is most likely benign, given its appearance. This can be evaluated on follow-up CT examinations during her course of therapy. 9.  Cholelithiasis without sonographic evidence of acute cholecystitis. 10. Small BILATERAL pleural effusions, LEFT greater than RIGHT, and associated mild passive atelectasis involving the lower lobes. Aortic Atherosclerosis (ICD10-170.0) I would telephoned these results directly to Dr. Burlene Arnt of the emergency department at the time of interpretation on 01/29/2019 at 3:30 p.m. Electronically Signed   By: Evangeline Dakin M.D.   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 EXAM: ABSCESS INJECTION COMPARISON:  CT abdomen pelvis-earlier same day; 01/30/2019; 01/29/2019; CT-guided percutaneous drainage catheter placement x2-01/30/2019 CONTRAST:  A total of 20 cc Omnipaque 300, was administered via both percutaneous drainage catheters. FLUOROSCOPY TIME:  1 minute (20 mGy) TECHNIQUE: The patient was positioned supine on the fluoroscopy table. A preprocedural spot fluoroscopic image was obtained of the left lower abdomen and existing percutaneous drainage catheters. Multiple spot fluoroscopic and radiographic images were obtained following the injection of a small amount of contrast via the existing percutaneous drainage catheter. Images were reviewed and the decision was made to remove the more inferiorly located percutaneous catheter. As such, the external portion of the drainage catheter was cut and drainage catheters  removed intact. A dressing was placed. The patient tolerated the procedure well without immediate postprocedural complication. FINDINGS: Preprocedural spot fluoroscopic image demonstrates unchanged positioning of both left-sided percutaneous drainage catheters. Fluoroscopic guided injection of the more inferiorly located percutaneous drainage catheter demonstrates opacification of decompressed abscess cavity  without evidence of fistulous connection to the intestines or communication with the adjacent percutaneous drainage catheter. Given this finding, as well as resolution of the caudal component of the left-sided pericolonic abscess on preceding abdominal CT, the decision was made to remove the percutaneous drainage catheter which was done at the patient's bedside without incident. Contrast injection of the more superiorly located previous drainage catheter demonstrates opacification of a decompressed ill-defined abscess cavity with eventual serpiginous communication to the adjacent small bowel. IMPRESSION: 1. Contrast injection of the more superior percutaneous drainage catheter demonstrates opacification of decompressed abscess cavity with fistulous connection to an adjacent loop of small bowel. 2. Contrast injection of the more inferior percutaneous drainage catheter demonstrates resolved pericolonic abscess without evidence of fistula. The more inferior percutaneous drainage catheter was removed at the patient's bedside. PLAN: - The patient and the patient's daughter were instructed to no longer flush the remaining percutaneous drainage catheter but to remain diligent records regarding drainage catheter output. - The patient will return to the interventional radiology drain clinic in 2 weeks for repeat drainage catheter injection (no CT). The patient and the patient's daughter know to call the interventional radiology drain clinic with any interval questions or concerns. Electronically Signed   By: Sandi Mariscal M.D.   On: 02/15/2019 12:39   Ct Image Guided Drainage By Percutaneous Catheter  Result Date: 01/30/2019 INDICATION: 83 year old with a recently diagnosed urothelial neoplasm at [facility] in [place]. By report, she underwent laparoscopic surgery for nephrectomy which was aborted due to the findings. She presented to the [facility] emergency department on 01/29/2019 with CT scan of the abdomen and  pelvis demonstrating an indeterminate fluid collection along the left pericolic gutter, worrisome for postoperative abscess confirmed with subsequent noncontrast CT scan performed 01/30/2019. Patient presents now for CT-guided percutaneous drainage catheter placement for infection source control purposes. EXAM: CT IMAGE GUIDED DRAINAGE BY PERCUTANEOUS CATHETER x 2 COMPARISON:  CT abdomen pelvis-01/29/2019; 01/30/2019 MEDICATIONS: The patient is currently admitted to the hospital and receiving intravenous antibiotics. The antibiotics were administered within an appropriate time frame prior to the initiation of the procedure. ANESTHESIA/SEDATION: Moderate (conscious) sedation was employed during this procedure. A total of Versed 1 mg and Fentanyl 50 mcg was administered intravenously. Moderate Sedation Time: 26 minutes. The patient's level of consciousness and vital signs were monitored continuously by radiology nursing throughout the procedure under my direct supervision. CONTRAST:  None COMPLICATIONS: None immediate. PROCEDURE: Informed written consent was obtained from the patient after a discussion of the risks, benefits and alternatives to treatment. The patient was placed supine on the CT gantry and a pre procedural CT was performed re-demonstrating the known abscess/fluid collection within the left mid hemiabdomen measuring approximately 7.9 x 5.6 cm (image 58, series 2) and additional component within the motor lower abdomen/pelvis measuring approximately 6.0 x 2.7 cm (image 65, series 2). The procedure was planned. A timeout was performed prior to the initiation of the procedure. The skin overlying the left mid and lower abdomen was prepped and draped in the usual sterile fashion. The overlying soft tissues were anesthetized with 1% lidocaine with epinephrine. 18 gauge trocar needles were utilized to coil short Amplatz wires within both collections. Propria position was  confirmed with CT imaging. Initially,  the track was dilated ultimately allowing placement of a 10 [place] all-purpose drainage catheter into the dominant abscess within the left mid hemiabdomen favored to be the collection most proximal to the expected location of enteric leak adjacent to the ascending portion of the duodenum. Following drainage catheter placement approximately 100 cc of purulent material was aspirated Repeat CT scan was performed demonstrating a persistent collection with the left lower abdomen/pelvis and as such decision was made to place an additional drainage catheter at this location. Again, the track was dilated allowing placement of a additional 10 French percutaneous drainage catheter. Next, approximately 30 cc of purulent material was aspirated. Final catheter positioning was confirmed with CT imaging and both catheters were flushed with a small amount of saline. The dominant collection adjacent to the expected location of the enteric leak was connected to a gravity bag, while the additional catheter located within the lower abdomen/pelvis was connected to a JP bulb. Dressings were applied. The patient tolerated the procedure well without immediate postprocedural complication. IMPRESSION: 1. Successful CT guided placement of a 10 [place] all purpose drain catheter into the dominant collection with the left mid hemiabdomen with aspiration of 100 cc of purulent material. This drainage catheter is felt to be located adjacent to the expected location of enteric leak associated with the ascending portion of the duodenum and as such was connected to a gravity bag. 2. Successful CT-guided placement of a 10 French all-purpose drainage catheter into remaining abscess within the left lower abdomen/pelvis with aspiration of 100 cc of purulent material. This drainage catheter was connected to a JP bulb. PLAN: - Recommend flushing both percutaneous drainage catheters with 10 cc saline per day. - Maintain diligent records regarding output from  both drainage catheters - Once the output from one or both drainage catheters has decreased the less than 10 cc per day (excluding flush), repeat CT imaging may be performed as indicated. - Recommend drainage catheter injection prior to drainage catheter removal. Electronically Signed   By: Sandi Mariscal M.D.   On: 01/30/2019 17:51   Ct Image Guided Drainage By Percutaneous Catheter  Result Date: 01/30/2019 INDICATION: 83 year old with a recently diagnosed urothelial neoplasm at [facility] in [place]. By report, she underwent laparoscopic surgery for nephrectomy which was aborted due to the findings. She presented to the [facility] emergency department on 01/29/2019 with CT scan of the abdomen and pelvis demonstrating an indeterminate fluid collection along the left pericolic gutter, worrisome for postoperative abscess confirmed with subsequent noncontrast CT scan performed 01/30/2019. Patient presents now for CT-guided percutaneous drainage catheter placement for infection source control purposes. EXAM: CT IMAGE GUIDED DRAINAGE BY PERCUTANEOUS CATHETER x 2 COMPARISON:  CT abdomen pelvis-01/29/2019; 01/30/2019 MEDICATIONS: The patient is currently admitted to the hospital and receiving intravenous antibiotics. The antibiotics were administered within an appropriate time frame prior to the initiation of the procedure. ANESTHESIA/SEDATION: Moderate (conscious) sedation was employed during this procedure. A total of Versed 1 mg and Fentanyl 50 mcg was administered intravenously. Moderate Sedation Time: 26 minutes. The patient's level of consciousness and vital signs were monitored continuously by radiology nursing throughout the procedure under my direct supervision. CONTRAST:  None COMPLICATIONS: None immediate. PROCEDURE: Informed written consent was obtained from the patient after a discussion of the risks, benefits and alternatives to treatment. The patient was placed supine on the CT gantry and a pre  procedural CT was performed re-demonstrating the known abscess/fluid collection within the left mid hemiabdomen  measuring approximately 7.9 x 5.6 cm (image 58, series 2) and additional component within the motor lower abdomen/pelvis measuring approximately 6.0 x 2.7 cm (image 65, series 2). The procedure was planned. A timeout was performed prior to the initiation of the procedure. The skin overlying the left mid and lower abdomen was prepped and draped in the usual sterile fashion. The overlying soft tissues were anesthetized with 1% lidocaine with epinephrine. 18 gauge trocar needles were utilized to coil short Amplatz wires within both collections. Propria position was confirmed with CT imaging. Initially, the track was dilated ultimately allowing placement of a 10 [place] all-purpose drainage catheter into the dominant abscess within the left mid hemiabdomen favored to be the collection most proximal to the expected location of enteric leak adjacent to the ascending portion of the duodenum. Following drainage catheter placement approximately 100 cc of purulent material was aspirated Repeat CT scan was performed demonstrating a persistent collection with the left lower abdomen/pelvis and as such decision was made to place an additional drainage catheter at this location. Again, the track was dilated allowing placement of a additional 10 French percutaneous drainage catheter. Next, approximately 30 cc of purulent material was aspirated. Final catheter positioning was confirmed with CT imaging and both catheters were flushed with a small amount of saline. The dominant collection adjacent to the expected location of the enteric leak was connected to a gravity bag, while the additional catheter located within the lower abdomen/pelvis was connected to a JP bulb. Dressings were applied. The patient tolerated the procedure well without immediate postprocedural complication. IMPRESSION: 1. Successful CT guided placement  of a 10 [place] all purpose drain catheter into the dominant collection with the left mid hemiabdomen with aspiration of 100 cc of purulent material. This drainage catheter is felt to be located adjacent to the expected location of enteric leak associated with the ascending portion of the duodenum and as such was connected to a gravity bag. 2. Successful CT-guided placement of a 10 French all-purpose drainage catheter into remaining abscess within the left lower abdomen/pelvis with aspiration of 100 cc of purulent material. This drainage catheter was connected to a JP bulb. PLAN: - Recommend flushing both percutaneous drainage catheters with 10 cc saline per day. - Maintain diligent records regarding output from both drainage catheters - Once the output from one or both drainage catheters has decreased the less than 10 cc per day (excluding flush), repeat CT imaging may be performed as indicated. - Recommend drainage catheter injection prior to drainage catheter removal. Electronically Signed   By: Sandi Mariscal M.D.   On: 01/30/2019 17:51   Ir Radiologist Eval & Mgmt  Result Date: 02/15/2019 Please refer to notes tab for details about interventional procedure. (Op Note)    Assessment and plan Patient is a 83 y.o. female presents to establish care for management of newly diagnosed high-grade urothelial carcinoma. 1. Urothelial carcinoma ([place])   2. Goals of care, counseling/discussion   3. Anemia, unspecified type   4. Malnutrition of moderate degree    Clinically T3N2Mx disease.  PET scan at [facility] was reviewed.  Also reviewed CT abdomen pelvis image independently and discussed with patient. Unfortunately it appears that she may has hepatic metastasis progression.  Intra-abdominal abscess have improved during the interval. I had a lengthy discussion with patient's daughter Tonya Crawford who requested an update. Today I also had a lengthy discussion with patient and daughter Tonya Crawford.  I recommend single agent  immunotherapy with Tecentriq or Keytruda.  Goal of care is with palliative intent.  PD-L1 CPS>= 10.  She has a good chance of responding to immunotherapy. I discussed the mechanism of action and rationale of using immunotherapy.  The goal of therapy is palliative; and length of treatments are likely ongoing/based upon the results of the scans. Discussed the potential side effects of immunotherapy including but not limited to diarrhea; skin rash; respiratory failure, neurotoxicity, elevated LFTs/endocrine abnormalities etc.  Patient appreciate explanation and she expresses her desire of pursuing comfort care and hospice.  She is not interested in any anti-neoplasm treatment. We have sent Omniseq testing at last visit. Will call company to cancel testing.   #Weight loss/poor oral intake/dehydration/malnutrition.  Continue Megace 40 mg twice daily. We will proceed with 1 L of normal saline today in the clinic.    Refer to hospice  We spent sufficient time to discuss many aspect of care, questions were answered to patient's satisfaction. Total face to face encounter time for this patient visit was 40 min. >50% of the time was  spent in counseling and coordination of care.       Earlie Server, MD, PhD Hematology Oncology [facility] at [facility] Pager- [phone] 02/18/2019

## 2019-02-18 NOTE — Progress Notes (Signed)
Patient here for follow up. Pt had one drain taken out this week.

## 2019-02-18 NOTE — Progress Notes (Signed)
Tonya Crawford  Telephone:([phone] [phone]   Name: Tonya Crawford Date: 02/18/2019 MRN: [id number]  DOB: 18-Apr-1936  Patient Care Team: Arnetha Courser, MD as PCP - General (Family Medicine)    REASON FOR CONSULTATION: Palliative Care consult requested for this 83 y.o. female with multiple medical problems including stage IV urothelial cancer, hydronephrosis status post ureteral stenting.  Patient underwent planned laparoscopic left nephrectomy on 01/13/2019 but procedure was aborted due to the presence of hard tissue surrounding the ureter.  Patient declined following discharge from the hospital.  She later presented to the ER with abdominal pain and fatigue and was found to have a abscess in the pelvis requiring percutaneous catheter placement for drainage.  PMH also notable for history of stage IIIa right breast cancer status post lumpectomy, hypertension, and thyroid disease.  Patient has had fatigue and weight loss.  Patient is felt not to be a candidate for chemotherapy due to poor performance status.  She was referred to palliative care to help address symptoms and goals.  SOCIAL HISTORY:    Patient is originally from [place] but moved to [place] to live with her daughter. She is divorced. Patient has four adult children. She is a retired Therapist, sports.   ADVANCE DIRECTIVES:  Does not have  CODE STATUS: DNR (MOST form completed on 02/16/19)  PAST MEDICAL HISTORY: Past Medical History:  Diagnosis Date  . Cancer [facility])    Breast Cancer  . Cancer ([place])    Urothilial Cancer  . Hypertension   . Thyroid disease     PAST SURGICAL HISTORY:  Past Surgical History:  Procedure Laterality Date  . BREAST LUMPECTOMY  12/12/2003  . IR RADIOLOGIST EVAL & MGMT  02/15/2019  . percutaneous drainage of abdomen abcess  01/30/2019    HEMATOLOGY/ONCOLOGY HISTORY:   No history exists.    ALLERGIES:  has No Known Allergies.  MEDICATIONS:  Current  Outpatient Medications  Medication Sig Dispense Refill  . alum & mag hydroxide-simeth (MAALOX/MYLANTA) 200-200-20 MG/5ML suspension Take 30 mLs by mouth every 4 (four) hours as needed for indigestion or heartburn. 355 mL 0  . feeding supplement, ENSURE ENLIVE, (ENSURE ENLIVE) LIQD Take 237 mLs by mouth 2 (two) times daily between meals. 237 mL 12  . ibuprofen (ADVIL,MOTRIN) 200 MG tablet Take 200 mg by mouth every 6 (six) hours as needed.    Marland Kitchen levothyroxine (SYNTHROID, LEVOTHROID) 88 MCG tablet Take 88 mcg by mouth daily.    Marland Kitchen lisinopril (PRINIVIL,ZESTRIL) 5 MG tablet Take 5 mg by mouth daily.    . megestrol (MEGACE) 40 MG tablet Take 1 tablet (40 mg total) by mouth 2 (two) times daily. 60 tablet 0  . mirtazapine (REMERON) 7.5 MG tablet Take 1 tablet (7.5 mg total) by mouth at bedtime. (Patient not taking: Reported on 02/18/2019) 30 tablet 2  . multivitamin-lutein (OCUVITE-LUTEIN) CAPS capsule Take 1 capsule by mouth daily for 30 days. 30 capsule 0  . oxyCODONE-acetaminophen (PERCOCET/ROXICET) 5-325 MG tablet Take 1 tablet by mouth every 6 (six) hours as needed for pain.    . polyethylene glycol (MIRALAX) packet Take 17 g by mouth daily. 14 each 0   No current facility-administered medications for this visit.     VITAL SIGNS: There were no vitals taken for this visit. There were no vitals filed for this visit.  Estimated body mass index is 18.63 kg/m as calculated from the following:   Height as of 01/30/19: 5' 6.5" (1.689 m).  Weight as of an earlier encounter on 02/18/19: 117 lb 3.2 oz (53.2 kg).  LABS: CBC:    Component Value Date/Time   WBC 10.7 (H) 02/14/2019 1150   HGB 9.7 (L) 02/14/2019 1150   HCT 31.5 (L) 02/14/2019 1150   PLT 246 02/14/2019 1150   MCV 87.5 02/14/2019 1150   NEUTROABS 8.7 (H) 02/14/2019 1150   LYMPHSABS 1.2 02/14/2019 1150   MONOABS 0.6 02/14/2019 1150   EOSABS 0.0 02/14/2019 1150   BASOSABS 0.0 02/14/2019 1150   Comprehensive Metabolic Panel:      Component Value Date/Time   NA 132 (L) 02/14/2019 1150   K 4.7 02/14/2019 1150   CL 99 02/14/2019 1150   CO2 25 02/14/2019 1150   BUN 22 02/14/2019 1150   CREATININE 1.02 (H) 02/14/2019 1150   GLUCOSE 87 02/14/2019 1150   CALCIUM 8.2 (L) 02/14/2019 1150   AST 42 (H) 02/14/2019 1150   ALT 33 02/14/2019 1150   ALKPHOS 495 (H) 02/14/2019 1150   BILITOT 1.1 02/14/2019 1150   PROT 7.2 02/14/2019 1150   ALBUMIN 2.5 (L) 02/14/2019 1150    RADIOGRAPHIC STUDIES: Ct Abdomen Pelvis Wo Contrast  Result Date: 01/30/2019 CLINICAL DATA:  83 year old with a recently diagnosed urothelial neoplasm at [facility] in [place]. By report, she underwent laparoscopic surgery for nephrectomy which was aborted due to the findings. She presents now with worsening generalized abdominal pain. She has leukocytosis, elevated lipase, elevated liver function tests, hypoalbuminemia and hypocalcemia on laboratory evaluation. CT scan of the abdomen and pelvis performed 01/29/2019 demonstrates an indeterminate fluid collection along the left pericolic gutter with concern for either vascular or enteric contrast extravasation. Please perform noncontrast abdominal CT for further evaluation of this indeterminate fluid collection. EXAM: CT ABDOMEN AND PELVIS WITHOUT CONTRAST TECHNIQUE: Multidetector CT imaging of the abdomen and pelvis was performed following the standard protocol without IV contrast. COMPARISON:  CT abdomen pelvis-01/29/2019 FINDINGS: Lower chest: Limited visualization the lower thorax demonstrates trace bilateral effusions with associated bibasilar heterogeneous opacities, left greater than right. No discrete focal airspace opacities. Normal heart size. Trace pericardial effusion. There is diffuse decreased attenuation of the intra cardiac blood pool suggestive anemia. Coronary artery calcifications. Hepatobiliary: Known ill-defined hepatic lesions are not well evaluated on present noncontrast examination. Trace  amount of perihepatic ascites unchanged. Suspected cholelithiasis. Otherwise, normal noncontrast appearance the gallbladder. Pancreas: Unchanged appearance of the approximately 1.0 cm hypoattenuating lesion within the mid body of the pancreas (image 27, series 2, incompletely characterized on present examination though potentially representative of a pancreatic cyst/IPMN is not found to demonstrate enhancement contrast-enhanced examination performed day prior. Spleen: Normal noncontrast appearance of the spleen. Adrenals/Urinary Tract: Redemonstrated infiltrative mass involving the left renal pelvis and proximal ureter with associated marked left-sided pelvicaliectasis. Excreted contrast from examination performed day prior remains within the right renal collecting system. No evidence of right-sided urinary obstruction. Minimal amount bilateral perinephric stranding. There is unchanged mild thickening the left mid gland without discrete nodule. Normal noncontrast appearance of the right adrenal gland. Enteric contrast is seen within the urinary bladder. Grossly unchanged appearance of previously identified loculated mixed air and fluid containing collection within the left pericolic gutter with dominant more anteriorly located collection measuring approximately 8.- x 5.9 x 8.9 cm (axial image 45, series 2; 36, series 5) and caudal component measuring approximately 5.5 x 2.6 x 6.8 cm (axial image 51 series 2; 47, series 5), unchanged, previously, 7.8 x 7.7 x 8.6 cm and 5.8 x 2.8 x 6.6  cm respectively. Previously noted high-density material within the collection has dissipated in the interval. Stomach/Bowel: Ingested enteric contrast is now seen extending to the level of the rectum. Grossly unchanged scattered foci of subcutaneous emphysema. No pneumatosis or portal venous gas. Vascular/Lymphatic: Atherosclerotic plaque within a tortuous but normal caliber abdominal aorta. Reproductive: Normal noncontrast appearance  of the pelvic organs. Re-demonstrated approximately 5.3 x 2.2 cm fluid collection with the pelvic cul-de-sac (image 73, series 2). Other: Redemonstrated subcutaneous emphysema about the body wall, presumably the residua of recent attempted laparoscopic intervention. Musculoskeletal: No acute or aggressive osseous abnormalities. Moderate DDD of L5-S1 and to a lesser extent, L4-L5 with disc space height loss, endplate irregularity small posteriorly directed disc osteophyte complexes at these locations. IMPRESSION: 1. Unchanged loculated mixed air and fluid containing collection within the left pericolic gutter with dissipated of previously noted high density material. Given lack of significant increase in size of the collection as well as proximity of the medial aspect of the collection to the ascending portion of the duodenum on preceding examination, the high-density material within the collection is favored to represented enteric contrast. 2. Unchanged appearance of approximately 5.3 x 2.3 cm collection within the pelvic cul-de-sac 3. Unchanged appearance of known infiltrative left-sided renal mass with associated marked upstream pelvicaliectasis. 4. Grossly unchanged suspected hepatic metastasis 5. Additional ancillary findings as above. 6. Aortic Atherosclerosis (ICD10-I70.0). PLAN: - The patient is to undergo CT-guided aspiration/drainage of loculated left-sided pericolic collections for diagnostic and therapeutic purposes. - Appropriateness of proceeding with left-sided percutaneous nephrostomy placement was discussed with on-call urologist, Dr. Junious Silk, and the decision was made NOT to proceed with image guided nephrostomy catheter placement at this time given concern for track seeding of the malignancy. Electronically Signed   By: Sandi Mariscal M.D.   On: 01/30/2019 10:38   Ct Abdomen Pelvis W Contrast  Result Date: 02/15/2019 CLINICAL DATA:  Recently diagnosed left-sided urethral neoplasm who underwent  attempted laparoscopic nephrectomy at an outside institution, however the procedure was aborted due to findings of advanced disease at the time of the operation. Patient subsequent presented to the [facility] emergency department on 01/29/2019 with abdominal pain with CT of the abdomen and pelvis demonstrating findings worrisome for development of a pericolonic abscess for which she underwent CT-guided placement of 2 percutaneous drainage catheters on 01/30/2019. Patient presents today to the interventional radiology drain Clinic for drainage catheter evaluation and management. EXAM: CT ABDOMEN AND PELVIS WITH CONTRAST TECHNIQUE: Multidetector CT imaging of the abdomen and pelvis was performed using the standard protocol following bolus administration of intravenous contrast. CONTRAST:  146m ISOVUE-300 IOPAMIDOL (ISOVUE-300) INJECTION 61% COMPARISON:  CT abdomen pelvis-01/30/2019; 01/29/2019; CT-guided percutaneous drainage catheter placement x2-01/29/2019 FINDINGS: Lower chest: Limited visualization of the lower thorax demonstrates minimal dependent subpleural ground-glass atelectasis. No discrete focal airspace opacities. No pleural effusion. Normal heart size.  No pericardial effusion. Hepatobiliary: Previously question ill-defined lesion within the subcapsular aspect the left lobe of the liver appears more conspicuous on the present examination measuring approximately 2.6 x 2.1 cm (image 20, series 2). There is marked mottled perfusion involving the posterior segment of the right lobe of the liver (image 11, series 2) with multiple apparent ill-defined hypoattenuating hepatic lesions with dominant lesion within the subcapsular aspect of the right lobe of the liver measuring approximately 1.8 x 1.5 cm (coronal image 60, series 6) and additional apparent lesion within the dome of the right lobe of the liver measuring approximately 1.4 x 1.1 cm (coronal image 65, series 6).  No intrahepatic biliary duct dilatation.  No ascites. Normal appearance of the gallbladder given degree distention. No radiopaque gallstones. No ascites. Pancreas: Redemonstrated approximately 0.9 cm hypoattenuating (13 Hounsfield unit) presumed pancreatic cyst. No definitive pancreatic ductal dilatation. Spleen: Normal appearance of the spleen. Adrenals/Urinary Tract: Grossly unchanged infiltrative mass involving the left renal pelvis extending to involve the superior aspect the left ureter (representative images 35 and 43, series 2). These findings are again associated with marked upstream pelvicaliectasis and mild asymmetric right-sided perinephric stranding. Normal appearance of the right kidney. Note is again made of a punctate (approximately 0.4 cm) nonobstructing right-sided renal stone. Otherwise, normal appearance of the right kidney. No right-sided urinary obstruction. Normal appearance the bilateral adrenal glands. Stomach/Bowel: Stable positioning of left-sided pericolonic percutaneous drainage catheters with reduction of pericolonic mixed air and fluid collections. Serpiginous collection remains within the left upper abdominal quadrant interposed between the tail of the pancreas, the spleen and kidney with dominant component measuring approximately 5.0 x 2.1 x 2.8 cm (axial image 24, series 2; coronal image 64, series 6). Ill-defined fluid collection within the pelvic cul-de-sac as resolved in the interval. No definitive new definable/drainable fluid collections. Moderate to large colonic stool burden without evidence of enteric obstruction. Normal appearance of the terminal ileum. The appendix is not visualized, however there is no pericecal inflammatory change. No pneumoperitoneum, pneumatosis or portal venous gas. Vascular/Lymphatic: Atherosclerotic plaque within a normal caliber abdominal aorta. The major branch vessels of the abdominal aorta appear patent on this non CTA examination. Unchanged scattered retroperitoneal adenopathy with  index left-sided periaortic lymph node measuring 1.3 cm in greatest short axis diameter (image 36, series 2 unchanged compared to the 01/29/19 20 examination. Reproductive: Normal appearance of the pelvic organs. No free fluid the pelvic cul-de-sac. Other: Regional soft tissues appear normal. Musculoskeletal: Ill-defined lytic lesions are seen within the pelvis (image 56, series 2 as well as scattered within the lumbar spine with index approximately 1 cm lesion within the T12 vertebral body (sagittal image 75, series 8) and additional smaller lesions seen within the L4 and L5 vertebral bodies (image 76, series 8). IMPRESSION: 1. Interval reduction/near resolution left-sided pericolonic complex fluid collections following percutaneous drainage catheter placement. Small (approximately 5 cm) serpiginous fluid collection persists within the left upper abdominal quadrant. 2. Ill-defined fluid collection with the pelvic cul-de-sac has resolved in the interval. No new definable/drainable fluid collections. 3. Similar appearance of known infiltrative mass involving the left renal collecting system superior aspect the left ureter with associated adjacent left-sided retroperitoneal lymphadenopathy. 4. Increased conspicuity of suspected advanced hepatic metastatic disease, potentially artifactual due to phase of enhancement on today's examination, though worrisome. Further evaluation with abdominal MRI could be performed as clinically indicated. 5. Ill-defined lytic lesions within the pelvis and lumbar spine, potentially areas osteopenia/osteoporosis though metastatic disease could have a similar appearance. Further evaluation with lumbar spine MRI could be performed as indicated. PLAN: Patient subsequently underwent fluoroscopic guided injection of both percutaneous drainage catheters. Electronically Signed   By: Sandi Mariscal M.D.   On: 02/15/2019 12:32   Ct Abdomen Pelvis W Contrast  Result Date: 01/29/2019 CLINICAL DATA:   83 year old with a recently diagnosed urothelial neoplasm at [facility] in [place]. By report, she underwent laparoscopic surgery for nephrectomy which was aborted due to the findings. She presents now with worsening generalized abdominal pain. She has leukocytosis, elevated lipase, elevated liver function tests, hypoalbuminemia and hypocalcemia on laboratory evaluation. EXAM: CT ABDOMEN AND PELVIS WITH CONTRAST TECHNIQUE: Multidetector CT imaging  of the abdomen and pelvis was performed using the standard protocol following bolus administration of intravenous contrast. Delayed imaging through the abdomen and pelvis was also performed. CONTRAST:  94m OMNIPAQUE IOHEXOL 300 MG/ML IV. Oral contrast was also administered. COMPARISON:  No outside imaging is currently available for comparison. FINDINGS: Lower chest: Small BILATERAL pleural effusions, LEFT greater than RIGHT, with associated passive atelectasis in the lower lobes. Visualized lung bases otherwise clear. Heart size upper normal. No pericardial effusion. Hepatobiliary: Geographic low attenuation scattered throughout the liver. Approximate 1.3 cm mass involving the LEFT lobe (series 2, image 20). No other discrete hepatic masses. Small gallstones in the gallbladder without CT evidence of acute cholecystitis. No biliary ductal dilation. Pancreas: Approximate 1.1 x 1.0 cm well-circumscribed simple cyst arising from the proximal tail of the pancreas. No other pancreatic masses. No peripancreatic edema/inflammation. Spleen: Normal in size and appearance. Small focus of accessory splenic tissue ANTERIOR to the spleen at the hilum. Adrenals/Urinary Tract: Normal appearing adrenal glands. Large mass with its epicenter in the LEFT renal pelvis extending along the length of the proximal and mid ureter in the LEFT retroperitoneum over a length of at least 15 cm, and perhaps involving the entire ureter. The mass causes marked hydronephrosis involving the LEFT  kidney with significant obstruction, as there is delayed contrast excretion by the LEFT kidney and no contrast in the collecting system on the delayed images. Mild RIGHT hydroureteronephrosis, though there is no obstructing stone or mass in the RIGHT ureter, and the ureter can be followed to the bladder on the delayed images. Adjacent non-obstructing RIGHT LOWER pole renal calculi, the largest measuring approximately 4 mm. RIGHT kidney otherwise normal in appearance. Filling defect dependently in the urinary bladder on the delayed images is likely blood clot; this clot is adjacent to the RIGHT ureteral orifice and may account for the mild hydroureteronephrosis. Stomach/Bowel: Stomach normal in appearance for the degree of distention. Normal-appearing small bowel, with upper normal caliber distal jejunal loops. Marked thickening of the wall of the descending colon, sigmoid colon and rectum. Sigmoid colon diverticulosis. Mobile cecum positioned in the RIGHT UPPER QUADRANT of the abdomen. Lipoma involving the ileocecal valve. Appendix not reliably identified, but no evidence of pericecal inflammation. Vascular/Lymphatic: Moderate aortoiliofemoral atherosclerosis without evidence of aneurysm. LEFT periaortic retroperitoneal lymphadenopathy adjacent to the urothelial neoplasm, the largest node measuring approximately 1.7 x 1.0 cm. Scattered normal sized lymph nodes elsewhere in the retroperitoneum. No pathologic lymphadenopathy elsewhere. Reproductive: Normal appearing uterus.  No adnexal masses. Other: Bilobed fluid collection with an enhancing wall containing gas in the LEFT mid abdomen, adjacent to the mid descending colon, measuring approximately 9 x 7.5 x 9.5 cm. There is high attenuation contrast material within this collection and since there is no oral contrast in the adjacent bowel, I believe this is extravasated vascular contrast from a branch of what I believe is the INFERIOR mesenteric vein. Fluid collection  with enhancing wall in the dependent portion of the pelvis measuring approximately 5 x 2 x 2 cm. Musculoskeletal: No acute findings. No evidence of osseous metastatic disease. Degenerative disc disease at L4-5 and L5-S1. Bone islands in the first sacral segment. IMPRESSION: 1. Large urothelial neoplasm involving the LEFT renal pelvis and the LEFT ureter, likely along its entire length, causing marked LEFT UPPER urinary tract obstruction. Adjacent LEFT periaortic retroperitoneal metastatic lymphadenopathy. 2. Large abscess in the LEFT mid abdomen, adjacent to the mid descending colon, with extravasated likely vascular contrast within the collection. It would appear that  the contrast arises from an INFERIOR mesenteric vessel, likely a branch of the INFERIOR mesenteric vein. 3. Smaller abscess in the dependent portion of the pelvis. 4. Descending and sigmoid colitis and proctitis. Sigmoid colon diverticulosis. 5. Filling defect in the urinary bladder on delayed imaging indicating a blood clot. This is causing partial obstruction of the RIGHT urinary tract, as there is mild hydroureteronephrosis but no obstructing stone or mass. 6. Non-obstructing approximate 4 mm calculus or adjacent calculi in a LOWER pole calyx of the RIGHT kidney. 7. Geographic areas of hepatic steatosis. Indeterminate solitary approximate 1.2 cm lesion involving the LEFT lobe of liver. 8. Approximate 1 cm simple cyst arising from the proximal tail of the pancreas. This is most likely benign, given its appearance. This can be evaluated on follow-up CT examinations during her course of therapy. 9. Cholelithiasis without sonographic evidence of acute cholecystitis. 10. Small BILATERAL pleural effusions, LEFT greater than RIGHT, and associated mild passive atelectasis involving the lower lobes. Aortic Atherosclerosis (ICD10-170.0) I would telephoned these results directly to Dr. Burlene Arnt of the emergency department at the time of interpretation on  01/29/2019 at 3:30 p.m. Electronically Signed   By: Evangeline Dakin M.D.   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 EXAM: ABSCESS INJECTION COMPARISON:  CT abdomen pelvis-earlier same day; 01/30/2019; 01/29/2019; CT-guided percutaneous drainage catheter placement x2-01/30/2019 CONTRAST:  A total of 20 cc Omnipaque 300, was administered via both percutaneous drainage catheters. FLUOROSCOPY TIME:  1 minute (20 mGy) TECHNIQUE: The patient was positioned supine on the fluoroscopy table. A preprocedural spot fluoroscopic image was obtained of the left lower abdomen and existing percutaneous drainage catheters. Multiple spot fluoroscopic and radiographic images were obtained following the injection of a small amount of contrast via the existing percutaneous drainage catheter. Images were reviewed and the decision was made to remove the more inferiorly located percutaneous catheter. As such, the external portion of the drainage catheter was cut and drainage catheters removed intact. A dressing was placed. The patient tolerated the procedure well without immediate postprocedural complication. FINDINGS: Preprocedural spot fluoroscopic image demonstrates unchanged positioning of both left-sided percutaneous drainage catheters. Fluoroscopic guided injection of the more inferiorly located percutaneous drainage catheter demonstrates opacification of decompressed abscess cavity without evidence of fistulous connection to the intestines or communication with the adjacent percutaneous drainage catheter. Given this finding, as well as resolution of the caudal component of the left-sided pericolonic abscess on preceding abdominal CT, the decision was made to remove the percutaneous drainage catheter which was done at the patient's  bedside without incident. Contrast injection of the more superiorly located previous drainage catheter demonstrates opacification of a decompressed ill-defined abscess cavity with eventual serpiginous communication to the adjacent small bowel. IMPRESSION: 1. Contrast injection of the more superior percutaneous drainage catheter demonstrates opacification of decompressed abscess cavity with fistulous connection to an adjacent loop of small bowel. 2. Contrast injection of the more inferior percutaneous drainage catheter demonstrates resolved pericolonic abscess without evidence of fistula. The more inferior percutaneous drainage catheter was removed at the patient's bedside. PLAN: - The patient and the patient's daughter were instructed to no longer flush the remaining percutaneous drainage catheter but to remain diligent records regarding drainage catheter output. - The patient will return to the interventional radiology drain clinic in 2 weeks for repeat drainage catheter injection (no CT). The patient  and the patient's daughter know to call the interventional radiology drain clinic with any interval questions or concerns. Electronically Signed   By: Sandi Mariscal M.D.   On: 02/15/2019 12:39   Ct Image Guided Drainage By Percutaneous Catheter  Result Date: 01/30/2019 INDICATION: 83 year old with a recently diagnosed urothelial neoplasm at [facility] in [place]. By report, she underwent laparoscopic surgery for nephrectomy which was aborted due to the findings. She presented to the [facility] emergency department on 01/29/2019 with CT scan of the abdomen and pelvis demonstrating an indeterminate fluid collection along the left pericolic gutter, worrisome for postoperative abscess confirmed with subsequent noncontrast CT scan performed 01/30/2019. Patient presents now for CT-guided percutaneous drainage catheter placement for infection source control purposes. EXAM: CT IMAGE GUIDED DRAINAGE BY PERCUTANEOUS  CATHETER x 2 COMPARISON:  CT abdomen pelvis-01/29/2019; 01/30/2019 MEDICATIONS: The patient is currently admitted to the hospital and receiving intravenous antibiotics. The antibiotics were administered within an appropriate time frame prior to the initiation of the procedure. ANESTHESIA/SEDATION: Moderate (conscious) sedation was employed during this procedure. A total of Versed 1 mg and Fentanyl 50 mcg was administered intravenously. Moderate Sedation Time: 26 minutes. The patient's level of consciousness and vital signs were monitored continuously by radiology nursing throughout the procedure under my direct supervision. CONTRAST:  None COMPLICATIONS: None immediate. PROCEDURE: Informed written consent was obtained from the patient after a discussion of the risks, benefits and alternatives to treatment. The patient was placed supine on the CT gantry and a pre procedural CT was performed re-demonstrating the known abscess/fluid collection within the left mid hemiabdomen measuring approximately 7.9 x 5.6 cm (image 58, series 2) and additional component within the motor lower abdomen/pelvis measuring approximately 6.0 x 2.7 cm (image 65, series 2). The procedure was planned. A timeout was performed prior to the initiation of the procedure. The skin overlying the left mid and lower abdomen was prepped and draped in the usual sterile fashion. The overlying soft tissues were anesthetized with 1% lidocaine with epinephrine. 18 gauge trocar needles were utilized to coil short Amplatz wires within both collections. Propria position was confirmed with CT imaging. Initially, the track was dilated ultimately allowing placement of a 10 [place] all-purpose drainage catheter into the dominant abscess within the left mid hemiabdomen favored to be the collection most proximal to the expected location of enteric leak adjacent to the ascending portion of the duodenum. Following drainage catheter placement approximately 100 cc of  purulent material was aspirated Repeat CT scan was performed demonstrating a persistent collection with the left lower abdomen/pelvis and as such decision was made to place an additional drainage catheter at this location. Again, the track was dilated allowing placement of a additional 10 French percutaneous drainage catheter. Next, approximately 30 cc of purulent material was aspirated. Final catheter positioning was confirmed with CT imaging and both catheters were flushed with a small amount of saline. The dominant collection adjacent to the expected location of the enteric leak was connected to a gravity bag, while the additional catheter located within the lower abdomen/pelvis was connected to a JP bulb. Dressings were applied. The patient tolerated the procedure well without immediate postprocedural complication. IMPRESSION: 1. Successful CT guided placement of a 10 [place] all purpose drain catheter into the dominant collection with the left mid hemiabdomen with aspiration of 100 cc of purulent material. This drainage catheter is felt to be located adjacent to the expected location of enteric leak associated with the ascending portion of the duodenum and as  such was connected to a gravity bag. 2. Successful CT-guided placement of a 10 French all-purpose drainage catheter into remaining abscess within the left lower abdomen/pelvis with aspiration of 100 cc of purulent material. This drainage catheter was connected to a JP bulb. PLAN: - Recommend flushing both percutaneous drainage catheters with 10 cc saline per day. - Maintain diligent records regarding output from both drainage catheters - Once the output from one or both drainage catheters has decreased the less than 10 cc per day (excluding flush), repeat CT imaging may be performed as indicated. - Recommend drainage catheter injection prior to drainage catheter removal. Electronically Signed   By: Sandi Mariscal M.D.   On: 01/30/2019 17:51   Ct Image Guided  Drainage By Percutaneous Catheter  Result Date: 01/30/2019 INDICATION: 83 year old with a recently diagnosed urothelial neoplasm at [facility] in [place]. By report, she underwent laparoscopic surgery for nephrectomy which was aborted due to the findings. She presented to the [facility] emergency department on 01/29/2019 with CT scan of the abdomen and pelvis demonstrating an indeterminate fluid collection along the left pericolic gutter, worrisome for postoperative abscess confirmed with subsequent noncontrast CT scan performed 01/30/2019. Patient presents now for CT-guided percutaneous drainage catheter placement for infection source control purposes. EXAM: CT IMAGE GUIDED DRAINAGE BY PERCUTANEOUS CATHETER x 2 COMPARISON:  CT abdomen pelvis-01/29/2019; 01/30/2019 MEDICATIONS: The patient is currently admitted to the hospital and receiving intravenous antibiotics. The antibiotics were administered within an appropriate time frame prior to the initiation of the procedure. ANESTHESIA/SEDATION: Moderate (conscious) sedation was employed during this procedure. A total of Versed 1 mg and Fentanyl 50 mcg was administered intravenously. Moderate Sedation Time: 26 minutes. The patient's level of consciousness and vital signs were monitored continuously by radiology nursing throughout the procedure under my direct supervision. CONTRAST:  None COMPLICATIONS: None immediate. PROCEDURE: Informed written consent was obtained from the patient after a discussion of the risks, benefits and alternatives to treatment. The patient was placed supine on the CT gantry and a pre procedural CT was performed re-demonstrating the known abscess/fluid collection within the left mid hemiabdomen measuring approximately 7.9 x 5.6 cm (image 58, series 2) and additional component within the motor lower abdomen/pelvis measuring approximately 6.0 x 2.7 cm (image 65, series 2). The procedure was planned. A timeout was performed prior to the  initiation of the procedure. The skin overlying the left mid and lower abdomen was prepped and draped in the usual sterile fashion. The overlying soft tissues were anesthetized with 1% lidocaine with epinephrine. 18 gauge trocar needles were utilized to coil short Amplatz wires within both collections. Propria position was confirmed with CT imaging. Initially, the track was dilated ultimately allowing placement of a 10 [place] all-purpose drainage catheter into the dominant abscess within the left mid hemiabdomen favored to be the collection most proximal to the expected location of enteric leak adjacent to the ascending portion of the duodenum. Following drainage catheter placement approximately 100 cc of purulent material was aspirated Repeat CT scan was performed demonstrating a persistent collection with the left lower abdomen/pelvis and as such decision was made to place an additional drainage catheter at this location. Again, the track was dilated allowing placement of a additional 10 French percutaneous drainage catheter. Next, approximately 30 cc of purulent material was aspirated. Final catheter positioning was confirmed with CT imaging and both catheters were flushed with a small amount of saline. The dominant collection adjacent to the expected location of the enteric leak was connected to  a gravity bag, while the additional catheter located within the lower abdomen/pelvis was connected to a JP bulb. Dressings were applied. The patient tolerated the procedure well without immediate postprocedural complication. IMPRESSION: 1. Successful CT guided placement of a 10 [place] all purpose drain catheter into the dominant collection with the left mid hemiabdomen with aspiration of 100 cc of purulent material. This drainage catheter is felt to be located adjacent to the expected location of enteric leak associated with the ascending portion of the duodenum and as such was connected to a gravity bag. 2. Successful  CT-guided placement of a 10 French all-purpose drainage catheter into remaining abscess within the left lower abdomen/pelvis with aspiration of 100 cc of purulent material. This drainage catheter was connected to a JP bulb. PLAN: - Recommend flushing both percutaneous drainage catheters with 10 cc saline per day. - Maintain diligent records regarding output from both drainage catheters - Once the output from one or both drainage catheters has decreased the less than 10 cc per day (excluding flush), repeat CT imaging may be performed as indicated. - Recommend drainage catheter injection prior to drainage catheter removal. Electronically Signed   By: Sandi Mariscal M.D.   On: 01/30/2019 17:51   Ir Radiologist Eval & Mgmt  Result Date: 02/15/2019 Please refer to notes tab for details about interventional procedure. (Op Note)   PERFORMANCE STATUS (ECOG) : 3 - Symptomatic, >50% confined to bed  REVIEW OF SYSTEMS: As noted above. Otherwise, a complete review of systems is negative.  PHYSICAL EXAM: General: NAD, frail appearing, thin Cardiovascular: regular rate and rhythm Pulmonary: clear ant fields Abdomen: soft, nontender, hypoactive bowel sounds, drain noted but not visualized Extremities: no edema Skin: no rashes Neurological: Weakness but otherwise nonfocal  IMPRESSION: Follow-up visit made today.  Patient was seen while receiving IV fluids.  Patient met today with Dr. Tasia Catchings.  Patient has decided to forego future treatment and focus instead on comfort measures at home.  Option of hospice was discussed in detail.  Patient and family would like to go ahead and pursue hospice at home.  Patient tried mirtazapine but felt groggy the next morning and so has decided to discontinue.  We discussed the option of using melatonin for sleep.  She is trying to minimize daytime napping and practice good sleep hygiene.  Patient met with the chaplain today and completed advance directives including healthcare power  of attorney and living well.  Daughter would like to order a wheelchair.  However, I suggested that she defer until patient is admitted to hospice services as they will cover DME.  PLAN: -Referral to hospice -Percocet prn Patient expressed understanding and was in agreement with this plan. She also understands that She can call clinic at any t -May try melatonin qhs for insomnia -RTC as needed  ime with any questions, concerns, or complaints.     Time Total: 20 minutes  Visit consisted of counseling and education dealing with the complex and emotionally intense issues of symptom management and palliative care in the setting of serious and potentially life-threatening illness.Greater than 50%  of this time was spent counseling and coordinating care related to the above assessment and plan.  Signed by: Altha Harm, PhD, NP-C [phone] (Work Cell)

## 2019-02-18 NOTE — Telephone Encounter (Addendum)
Family refused home health referral stating they want Palliative Care and when it is time transition to Hospice care. They will not be opening her to services

## 2019-02-18 NOTE — Progress Notes (Signed)
Daughter requested a letter for jury duty.

## 2019-02-18 NOTE — Telephone Encounter (Signed)
02/18/19 10:40am - I left another message regarding scheduling genetic counseling appt. Closing case for now.  Steele Berg, [place], [place] Genetic Counselor Phone: [phone]

## 2019-02-18 NOTE — Telephone Encounter (Signed)
Thanks

## 2019-02-18 NOTE — Telephone Encounter (Signed)
Order sent to [facility] for [facility] CDx Tissue testing on 02/14/2001.   As of today, patient has decided to proceed with hospice care rather than receiving treatment.  [employer], spoke with [place], requested for order for testing to be cancelled.

## 2019-02-21 ENCOUNTER — Encounter: Payer: Self-pay | Admitting: Genetic Counselor

## 2019-02-21 ENCOUNTER — Telehealth: Payer: Self-pay | Admitting: Genetic Counselor

## 2019-02-21 DIAGNOSIS — C689 Malignant neoplasm of urinary organ, unspecified: Secondary | ICD-10-CM

## 2019-02-21 LAB — SLIDE CONSULT, PATHOLOGY ARMC

## 2019-02-21 NOTE — Telephone Encounter (Signed)
Cancer Genetics            Telegenetics Initial Visit    Patient Name: Tonya Crawford Patient DOB: 04/24/1936 Patient Age: 83 y.o. Phone Call Date: 02/21/2019  Referring Provider: Earlie Server, MD  Reason for Visit: Evaluate for hereditary susceptibility to cancer    Assessment and Plan:  . Tonya Crawford personal history of two cancers (breast and urothelial) warrants a genetics evaluation even though Tonya family history is not suggestive of a hereditary predisposition to cancer.   . Testing is recommended to determine whether she has a pathogenic mutation. A negative result will be reassuring.  . Tonya Crawford wished to pursue genetic testing, and will schedule a blood draw. Analysis will include the 84 genes on [facility]'s Multi-Cancer panel (AIP, ALK, APC, ATM, AXIN2, BAP1, BARD1, BLM, BMPR1A, BRCA1, BRCA2, BRIP1, CASR, CDC73, CDH1, CDK4, CDKN1B, CDKN1C, CDKN2A, CEBPA, CHEK2, CTNNA1, DICER1, DIS3L2, EGFR, EPCAM, FH, FLCN, GATA2, GPC3, GREM1, HOXB13, HRAS, KIT, MAX, MEN1, MET, MITF, MLH1, MSH2, MSH3, MSH6, MUTYH, NBN, NF1, NF2, NTHL1, PALB2, PDGFRA, PHOX2B, PMS2, POLD1, POLE, POT1, PRKAR1A, PTCH1, PTEN, RAD50, RAD51C, RAD51D, RB1, RECQL4, RET, RUNX1, SDHA, SDHAF2, SDHB, SDHC, SDHD, SMAD4, SMARCA4, SMARCB1, SMARCE1, STK11, SUFU, TERC, TERT, TMEM127, TP53, TSC1, TSC2, VHL, WRN, WT1).  . Once the lab receives Tonya specimen, results should be available in approximately 2-3 weeks, at which point I will contact Tonya and address implications for Tonya as well as address genetic testing for at-risk family members, if needed.     Tonya Crawford and Tonya Crawford, Tonya Crawford, questions were answered to Tonya satisfaction today and she is welcome to call with any additional questions or concerns. Thank you for the referral and allowing me to share in the care of your patient.   Dr. Grayland Ormond was available for questions concerning this case. Total time spent by counseling by phone was approximately 25 minutes.    _____________________________________________________________________   History of Present Illness: Tonya Crawford, a 83 y.o. female, was referred for genetic counseling to discuss the possibility of a hereditary predisposition to cancer and discuss whether genetic testing is warranted. This was a telegenetics visit via phone. Tonya Crawford, Tonya Crawford, was with Tonya for the duration of the call.  Tonya Crawford was recently diagnosed with metastatic urothelial cancer at the age of 81. She also has a history of breast cancer diagnosed at age 63 which was treated with lumpectomy, chemotherapy, radiation and Tamoxifen.   Past Medical History:  Diagnosis Date  . Cancer [facility])    Breast Cancer  . Cancer ([place])    Urothilial Cancer  . Hypertension   . Thyroid disease     Past Surgical History:  Procedure Laterality Date  . BREAST LUMPECTOMY  12/12/2003  . IR RADIOLOGIST EVAL & MGMT  02/15/2019  . percutaneous drainage of abdomen abcess  01/30/2019    Family History: Significant diagnoses include the following:  Family History  Problem Relation Age of Onset  . Lung cancer Sister        deceased 17  . Adrenal disorder Sister        deceased 55  . Heart attack Father        deceased 66  . Stomach cancer Maternal Aunt 42       deceased 28    Additionlly, Tonya Crawford has a son (age 38) and 3 daughters (ages 39, 63, and 71). She had a total of 7 sisters and  a brother. Tonya mother died at 19, cancer-free. Tonya mother had a sister and a half-brother. Tonya father died at 31 of an MI. He had 3 sisters.  Tonya Crawford ancestry is Caucasian - NOS. There is no known Jewish ancestry and no consanguinity.  Discussion: We reviewed the characteristics, features and inheritance patterns of hereditary cancer syndromes. We discussed Tonya risk of harboring a mutation in the context of Tonya personal and family history. We discussed the process of genetic testing, insurance coverage and implications of results:  positive, negative and variant of uncertain significance (VUS).      Steele Berg, MS, [place] Certified Genetic Counselor phone: [phone]

## 2019-02-22 ENCOUNTER — Other Ambulatory Visit: Payer: Medicare HMO

## 2019-02-22 ENCOUNTER — Inpatient Hospital Stay: Payer: Medicare HMO | Admitting: Oncology

## 2019-02-22 ENCOUNTER — Encounter: Payer: Medicare HMO | Admitting: Hospice and Palliative Medicine

## 2019-02-22 ENCOUNTER — Inpatient Hospital Stay: Payer: Medicare HMO

## 2019-02-22 ENCOUNTER — Ambulatory Visit: Payer: Medicare HMO | Admitting: Oncology

## 2019-02-23 ENCOUNTER — Inpatient Hospital Stay: Payer: Medicare HMO

## 2019-02-23 ENCOUNTER — Other Ambulatory Visit: Payer: Self-pay

## 2019-02-25 ENCOUNTER — Other Ambulatory Visit: Payer: Medicare HMO

## 2019-02-28 ENCOUNTER — Other Ambulatory Visit: Payer: Self-pay | Admitting: *Deleted

## 2019-02-28 MED ORDER — OXYCODONE-ACETAMINOPHEN 5-325 MG PO TABS: 1.0000 | ORAL_TABLET | Freq: Four times a day (QID) | ORAL | 0 refills | Status: DC | PRN

## 2019-02-28 NOTE — Telephone Encounter (Signed)
Mitzi, RN with Hospice called to request a 15 day supply of patient's pain medication to be sent to [facility] pharmacy, as her pain is increasing, and to keep the family visits to the pharmacy down. Also, she wanted to remind Dr. Tasia Catchings that the standing orders for this patient have not been signed. [phone].

## 2019-03-01 ENCOUNTER — Other Ambulatory Visit: Payer: Medicare HMO

## 2019-03-04 ENCOUNTER — Telehealth: Payer: Self-pay | Admitting: Genetic Counselor

## 2019-03-04 ENCOUNTER — Encounter: Payer: Self-pay | Admitting: Genetic Counselor

## 2019-03-04 NOTE — Telephone Encounter (Signed)
Cancer Genetics             Telegenetics Results Disclosure   Patient Name: Teiara Baria Patient DOB: 1936/07/01 Patient Age: 83 y.o. Phone Call Date: 03/04/2019  Referring Provider: Earlie Server, MD    Ms. Cappucci was called today to discuss genetic test results. Please see the Genetics telephone note from 02/21/19 for a detailed discussion of her personal and family histories and the recommendations provided. Per her request, the results were discussed with her daughter, Loren Racer.  Genetic Testing: At the time of Ms. Macfarlane telegenetics visit, she decided to pursue genetic testing of multiple genes associated with hereditary susceptibility to cancer. Testing included sequencing and deletion/duplication analysis. Testing did not reveal a pathogenic mutation in any of the genes analyzed.  A copy of the genetic test report will be scanned into Epic under the Media tab.  A Variant of Uncertain Significance was detected: SMARCA4 c.505C>T (p.Pro169Ser). This is still considered a normal result. While at this time, it is unknown if this finding is associated with increased cancer risk, the majority of these variants get reclassified to be inconsequential. Medical management should not be based on this finding. The lab will eventually determine the significance, if any. If it gets reclassified by the lab, she will receive a new report via their secure portal, by secure email, or by mail. Ms. Carreno should stay in contact with [place] on a yearly basis if she is interested in knowing the status of this result and keep her address and phone number up to date in the system.  Interpretation: These results suggest that Ms. Voytko cancer was most likely not due to an inherited predisposition. Most cancers happen by chance and this test, along with details of her family history, suggests that her cancer falls into this category.   Since the current test is not perfect, it is possible that  there may be a gene mutation that current testing cannot detect, but that chance is small. It is possible that a different genetic factor, which has not yet been discovered or is not on this panel, is responsible for the cancer diagnoses in the family. Again, the likelihood of this is low. No additional testing is recommended at this time for Ms. Bohle.  Genes Analyzed: The genes analyzed were the 84 genes on [facility]'s Multi-Cancer panel (AIP, ALK, APC, ATM, AXIN2, BAP1, BARD1, BLM, BMPR1A, BRCA1, BRCA2, BRIP1, CASR, CDC73, CDH1, CDK4, CDKN1B, CDKN1C, CDKN2A, CEBPA, CHEK2, CTNNA1, DICER1, DIS3L2, EGFR, EPCAM, FH, FLCN, GATA2, GPC3, GREM1, HOXB13, HRAS, KIT, MAX, MEN1, MET, MITF, MLH1, MSH2, MSH3, MSH6, MUTYH, NBN, NF1, NF2, NTHL1, PALB2, PDGFRA, PHOX2B, PMS2, POLD1, POLE, POT1, PRKAR1A, PTCH1, PTEN, RAD50, RAD51C, RAD51D, RB1, RECQL4, RET, RUNX1, SDHA, SDHAF2, SDHB, SDHC, SDHD, SMAD4, SMARCA4, SMARCB1, SMARCE1, STK11, SUFU, TERC, TERT, TMEM127, TP53, TSC1, TSC2, VHL, WRN, WT1).  Cancer Screening: Ms. Sobiech is recommended to follow the cancer screening guidelines provided by her physicians.   Family Members: Family members are at some increased risk of developing cancer, over the general population risk, simply due to the family history. They are recommended to speak with their own providers about appropriate cancer screenings.   Any relative who had cancer at a young age or had a particularly rare cancer may also wish to pursue genetic testing. Genetic counselors can be located in other cities, by visiting the website of the  [employer] ([employer]) and Field seismologist for a Dietitian by zip code.   Family members are not recommended to get tested for the above VUS outside of a research protocol as this finding has no implications for their medical management.  Follow-Up: Cancer genetics is a rapidly advancing field and it is possible that new genetic tests will be  appropriate for Ms. Rogoff in the future. Ms. Bratcher is encouraged to remain in contact with Genetics on an annual basis so we can update her personal and family histories, and let her know of advances in cancer genetics that may benefit the family. Ms. Dethlefs questions were answered to her satisfaction today, and she knows she is welcome to call anytime with additional questions.    Steele Berg, MS, [place] Certified Genetic Counselor phone: [phone]

## 2019-03-09 ENCOUNTER — Telehealth: Payer: Self-pay | Admitting: *Deleted

## 2019-03-09 ENCOUNTER — Other Ambulatory Visit: Payer: Self-pay | Admitting: Oncology

## 2019-03-09 MED ORDER — MORPHINE SULFATE (CONCENTRATE) 20 MG/ML PO SOLN: 10.0000 mg | ORAL | 0 refills | Status: AC | PRN

## 2019-03-09 MED ORDER — PREDNISONE 1 MG PO TABS: 3.0000 mg | ORAL_TABLET | Freq: Every day | ORAL | 0 refills | Status: AC

## 2019-03-09 NOTE — Telephone Encounter (Signed)
Please see below.

## 2019-03-09 NOTE — Telephone Encounter (Signed)
Spoke to [employer] and notified her of medication changes.

## 2019-03-09 NOTE — Telephone Encounter (Signed)
I will sent her Rx of low dose prednisone. Advise to stop oxycodone. I will send Roxanol.

## 2019-03-09 NOTE — Telephone Encounter (Signed)
Mitzi called reporting that patient is having decreased energy and Mitzi requests a steroid prescription be sent in for patient. Also reports that patient is having difficulty swallowing her Oxycodone tabs and is going to need liquid pain medicine very soon  (Roxanol) Right now family is crushing her Oxycodone and putting in applesauce. Please advise

## 2019-03-21 ENCOUNTER — Encounter: Payer: Self-pay | Admitting: Oncology

## 2019-03-21 ENCOUNTER — Telehealth: Payer: Self-pay | Admitting: *Deleted

## 2019-04-08 ENCOUNTER — Telehealth: Payer: Self-pay

## 2019-04-08 NOTE — Telephone Encounter (Signed)
Death certificate signed by Dr. Tasia Catchings and was placed in enclosed self-addressed envelope. Placed in mail to be sent to [place] as requested by attached letter. Copy of death certificate made and placed to scan for chart.

## 2019-04-15 NOTE — Telephone Encounter (Signed)
Hospice nurse [place] called to report that patient expired this morning at 9:45 AM

## 2019-04-15 NOTE — Telephone Encounter (Signed)
Noted! Thank you

## 2019-04-15 DEATH — deceased

## 2019-05-04 ENCOUNTER — Telehealth: Payer: Self-pay | Admitting: *Deleted

## 2019-05-05 NOTE — Telephone Encounter (Signed)
Entered in error

## 2020-06-10 IMAGING — CT CT ABD-PELV W/O
2 of 4 series · 13 of 46 positions shown, 15 images · non-contrast
Comparison: CT abdomen pelvis-01/29/2019

CLINICAL DATA: 82-year-old with a recently diagnosed urothelial
laparoscopic surgery for nephrectomy which was aborted due to the
findings. She presents now with worsening generalized abdominal
pain. She has leukocytosis, elevated lipase, elevated liver function
tests, hypoalbuminemia and hypocalcemia on laboratory evaluation.
TECHNIQUE: Multidetector CT imaging of the abdomen and pelvis was performed
following the standard protocol without IV contrast.

[Series 2: routine abd/pel wo · axial · 0.70mm/px · z∈[-419,-19]mm · 10 of 94 slices shown, 12 images]
[im 7/94  soft-tissue]
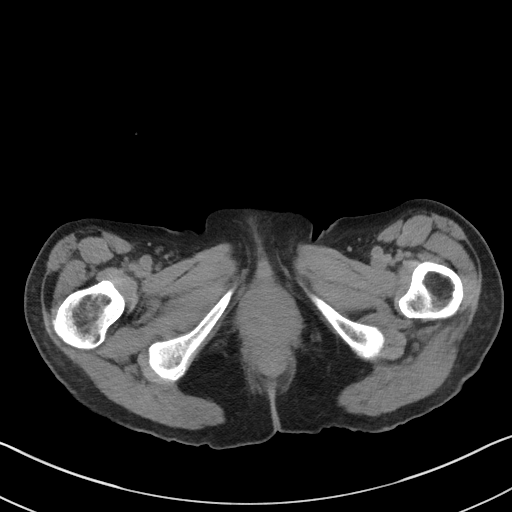
[im 7/94  bone]
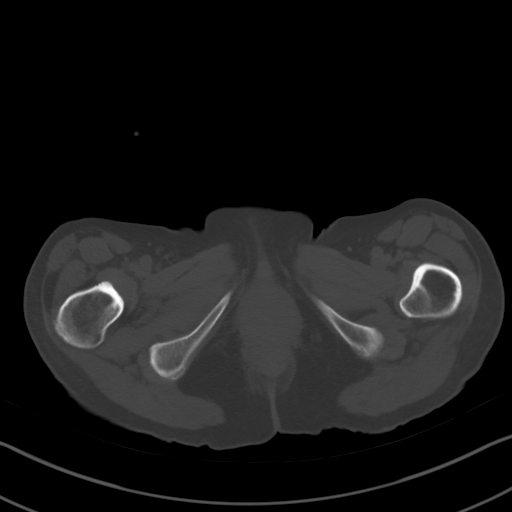
[im 14/94  soft-tissue]
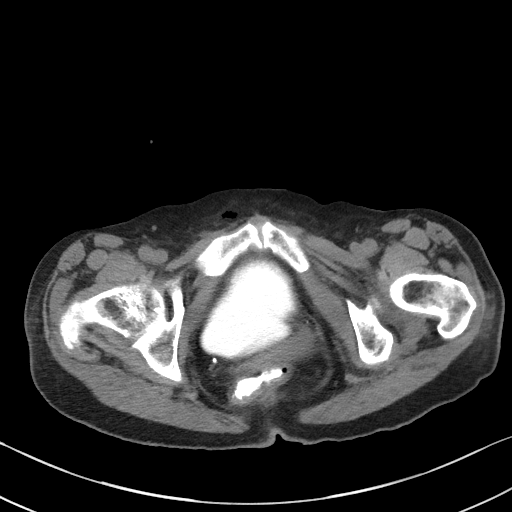
[im 25/94  soft-tissue]
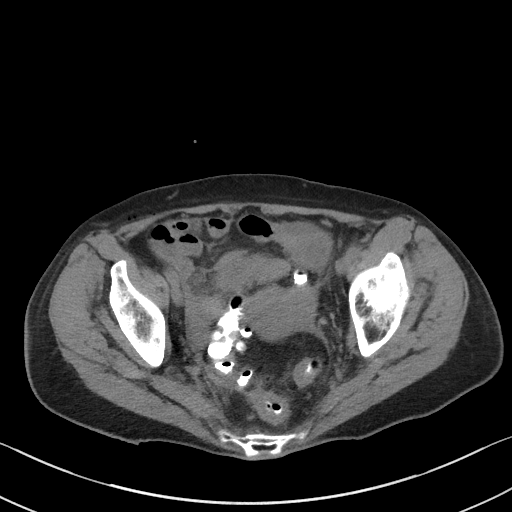
[im 35/94  soft-tissue]
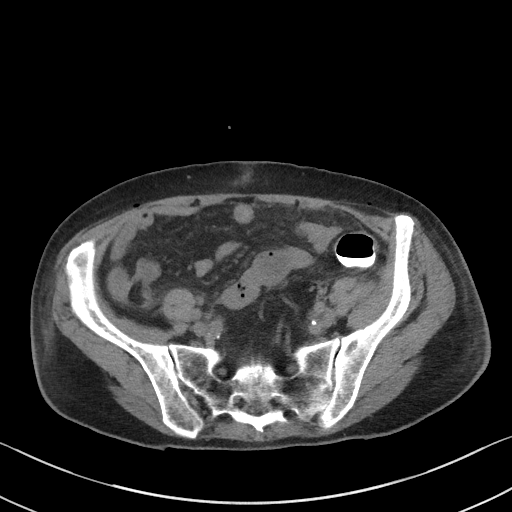
[im 42/94  soft-tissue]
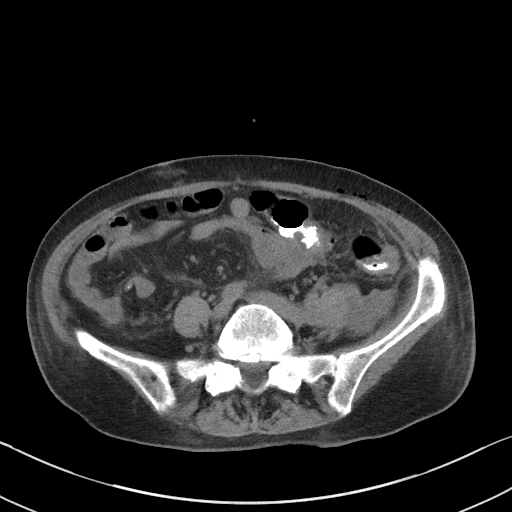
[im 52/94  soft-tissue]
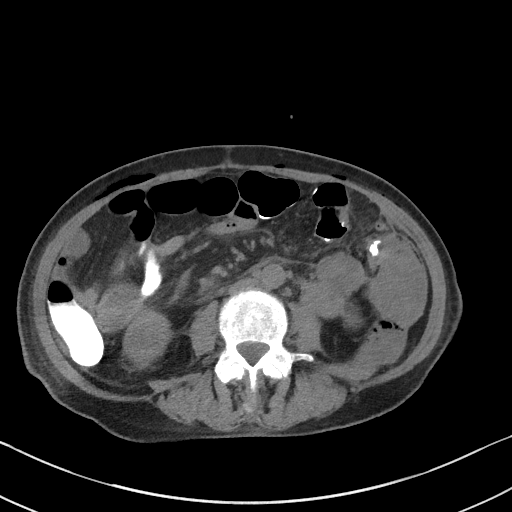
[im 59/94  soft-tissue]
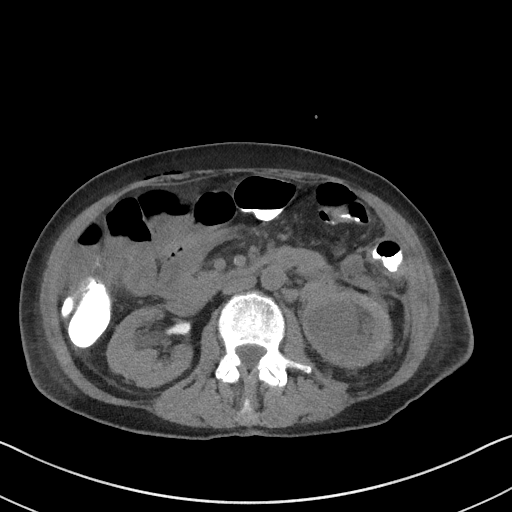
[im 69/94  soft-tissue]
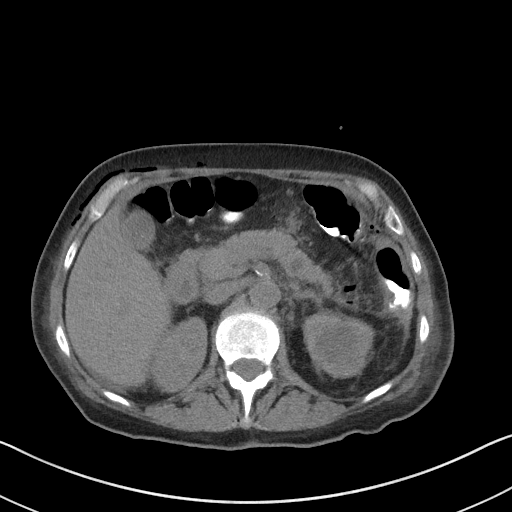
[im 80/94  soft-tissue]
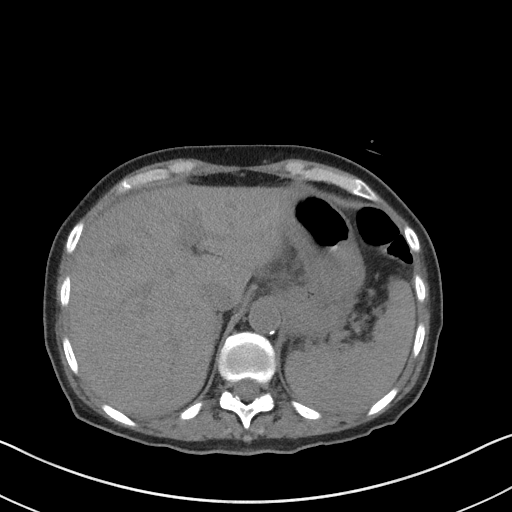
[im 80/94  bone]
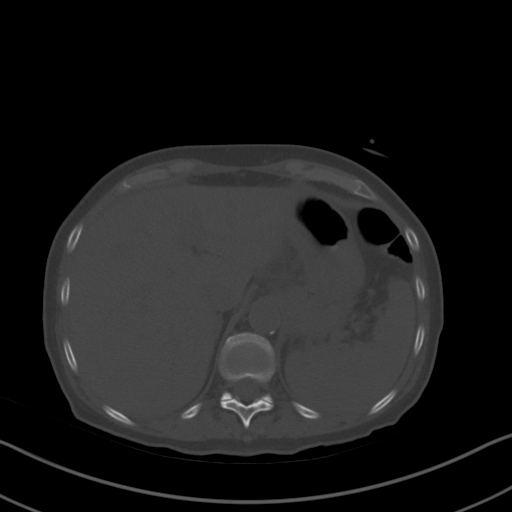
[im 87/94  soft-tissue]
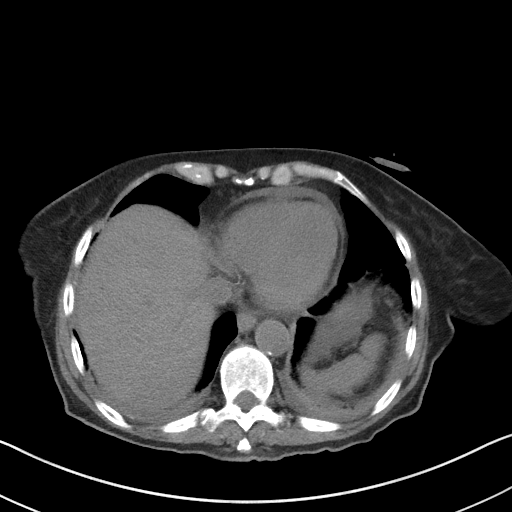

[Series 5: coronal st · coronal · 0.69mm/px · 3 of 77 slices shown]
[im 26/77  soft-tissue]
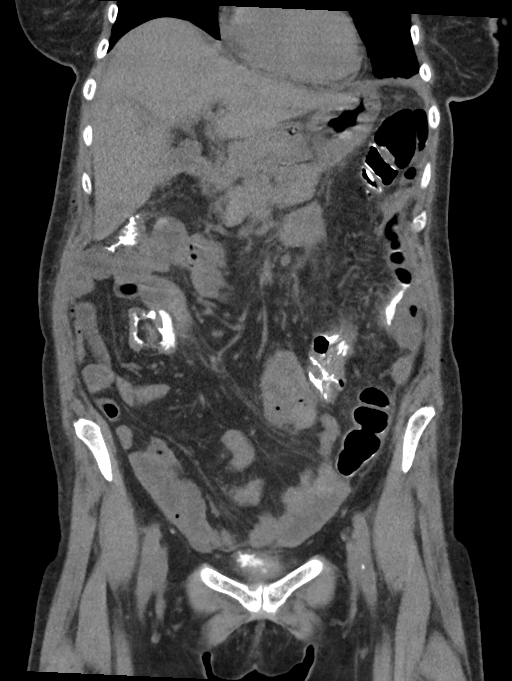
[im 34/77  soft-tissue]
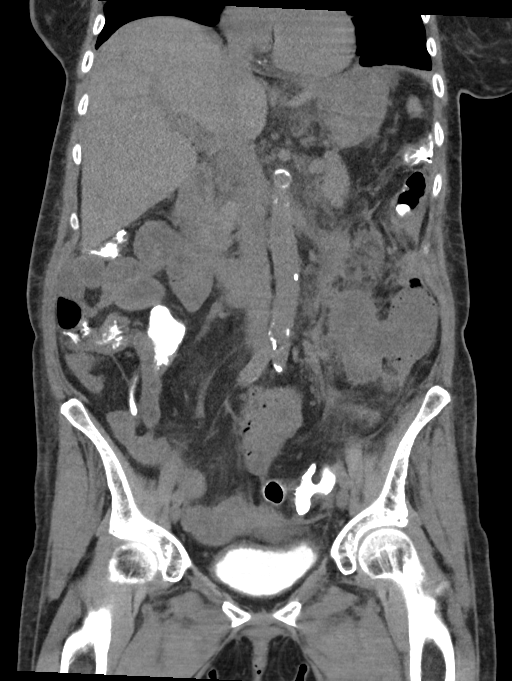
[im 43/77  soft-tissue]
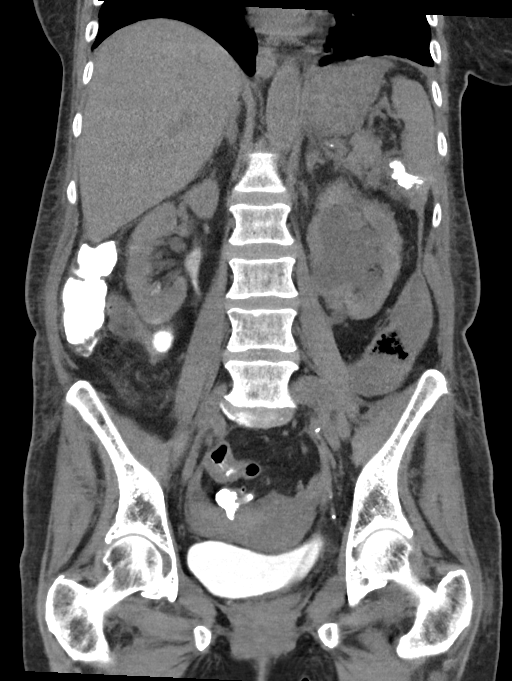

[13 of 46 positions shown; findings below may reference images not displayed]

CT scan of the abdomen and pelvis performed 01/29/2019 demonstrates
an indeterminate fluid collection along the left pericolic gutter
with concern for either vascular or enteric contrast extravasation.

Please perform noncontrast abdominal CT for further evaluation of
this indeterminate fluid collection.

EXAM:
CT ABDOMEN AND PELVIS WITHOUT CONTRAST
FINDINGS: Lower chest: Limited visualization the lower thorax demonstrates
trace bilateral effusions with associated bibasilar heterogeneous
opacities, left greater than right. No discrete focal airspace
opacities.

Normal heart size. Trace pericardial effusion. There is diffuse
decreased attenuation of the intra cardiac blood pool suggestive
anemia. Coronary artery calcifications.

Hepatobiliary: Known ill-defined hepatic lesions are not well
evaluated on present noncontrast examination. Trace amount of
perihepatic ascites unchanged. Suspected cholelithiasis. Otherwise,
normal noncontrast appearance the gallbladder.

Pancreas: Unchanged appearance of the approximately 1.0 cm
hypoattenuating lesion within the mid body of the pancreas (image
27, series 2, incompletely characterized on present examination
though potentially representative of a pancreatic cyst/IPMN is not
found to demonstrate enhancement contrast-enhanced examination
performed day prior.

Spleen: Normal noncontrast appearance of the spleen.

Adrenals/Urinary Tract: Redemonstrated infiltrative mass involving
the left renal pelvis and proximal ureter with associated marked
left-sided pelvicaliectasis.

Excreted contrast from examination performed day prior remains
within the right renal collecting system. No evidence of right-sided
urinary obstruction. Minimal amount bilateral perinephric stranding.

There is unchanged mild thickening the left mid gland without
discrete nodule. Normal noncontrast appearance of the right adrenal
gland. Enteric contrast is seen within the urinary bladder.

Grossly unchanged appearance of previously identified loculated
mixed air and fluid containing collection within the left pericolic
gutter with dominant more anteriorly located collection measuring
approximately 8.- x 5.9 x 8.9 cm (axial image 45, series [DATE],
series 5) and caudal component measuring approximately 5.5 x 2.6 x
6.8 cm (axial image 51 series [DATE], series 5), unchanged,
previously, 7.8 x 7.7 x 8.6 cm and 5.8 x 2.8 x 6.6 cm respectively.
Previously noted high-density material within the collection has
dissipated in the interval.

Stomach/Bowel: Ingested enteric contrast is now seen extending to
the level of the rectum. Grossly unchanged scattered foci of
subcutaneous emphysema. No pneumatosis or portal venous gas.

Vascular/Lymphatic: Atherosclerotic plaque within a tortuous but
normal caliber abdominal aorta.

Reproductive: Normal noncontrast appearance of the pelvic organs.
Re-demonstrated approximately 5.3 x 2.2 cm fluid collection with the
pelvic cul-de-sac (image 73, series 2).

Other: Redemonstrated subcutaneous emphysema about the body wall,
presumably the residua of recent attempted laparoscopic
intervention.

Musculoskeletal: No acute or aggressive osseous abnormalities.
Moderate DDD of L5-S1 and to a lesser extent, L4-L5 with disc space
height loss, endplate irregularity small posteriorly directed disc
osteophyte complexes at these locations.
IMPRESSION: 1. Unchanged loculated mixed air and fluid containing collection
within the left pericolic gutter with dissipated of previously noted
high density material. Given lack of significant increase in size of
the collection as well as proximity of the medial aspect of the
collection to the ascending portion of the duodenum on preceding
examination, the high-density material within the collection is
favored to represented enteric contrast.
2. Unchanged appearance of approximately 5.3 x 2.3 cm collection
within the pelvic cul-de-sac
3. Unchanged appearance of known infiltrative left-sided renal mass
with associated marked upstream pelvicaliectasis.
4. Grossly unchanged suspected hepatic metastasis
5. Additional ancillary findings as above.
6. Aortic Atherosclerosis ([id number]).

PLAN:
- The patient is to undergo CT-guided aspiration/drainage of
loculated left-sided pericolic collections for diagnostic and
therapeutic purposes.

- Appropriateness of proceeding with left-sided percutaneous
nephrostomy placement was discussed with on-call urologist, Dr.
Heguaburo, and the decision was made NOT to proceed with image guided
nephrostomy catheter placement at this time given concern for track
seeding of the malignancy.
